# Patient Record
Sex: Female | Born: 1940 | Race: Black or African American | Hispanic: No | Smoking: Former smoker
Health system: Southern US, Community
[De-identification: ages and names within clinical notes are randomized; demographics above are authoritative.]

## PROBLEM LIST (undated history)

## (undated) DIAGNOSIS — E785 Hyperlipidemia, unspecified: Secondary | ICD-10-CM

## (undated) DIAGNOSIS — E079 Disorder of thyroid, unspecified: Secondary | ICD-10-CM

## (undated) HISTORY — DX: Hyperlipidemia, unspecified: E78.5

---

## 1988-01-25 HISTORY — PX: THYROIDECTOMY: SHX17

## 1991-08-15 DIAGNOSIS — E890 Postprocedural hypothyroidism: Secondary | ICD-10-CM | POA: Insufficient documentation

## 2000-01-25 HISTORY — PX: CHOLECYSTECTOMY: SHX55

## 2002-06-07 DIAGNOSIS — K289 Gastrojejunal ulcer, unspecified as acute or chronic, without hemorrhage or perforation: Secondary | ICD-10-CM | POA: Insufficient documentation

## 2002-06-07 DIAGNOSIS — B9681 Helicobacter pylori [H. pylori] as the cause of diseases classified elsewhere: Secondary | ICD-10-CM

## 2002-06-07 HISTORY — DX: Helicobacter pylori (H. pylori) as the cause of diseases classified elsewhere: K28.9

## 2002-06-07 HISTORY — DX: Helicobacter pylori (H. pylori) as the cause of diseases classified elsewhere: B96.81

## 2004-05-27 ENCOUNTER — Ambulatory Visit: Payer: Self-pay | Admitting: Gastroenterology

## 2004-11-29 ENCOUNTER — Ambulatory Visit: Payer: Self-pay | Admitting: Family Medicine

## 2005-12-30 ENCOUNTER — Ambulatory Visit: Payer: Self-pay | Admitting: Family Medicine

## 2007-04-03 ENCOUNTER — Ambulatory Visit: Payer: Self-pay | Admitting: Family Medicine

## 2007-07-11 DIAGNOSIS — K589 Irritable bowel syndrome without diarrhea: Secondary | ICD-10-CM | POA: Insufficient documentation

## 2008-04-04 ENCOUNTER — Ambulatory Visit: Payer: Self-pay | Admitting: Family Medicine

## 2009-04-30 DIAGNOSIS — E039 Hypothyroidism, unspecified: Secondary | ICD-10-CM | POA: Insufficient documentation

## 2009-05-07 ENCOUNTER — Ambulatory Visit: Payer: Self-pay | Admitting: Family Medicine

## 2010-05-14 ENCOUNTER — Ambulatory Visit: Payer: Self-pay | Admitting: Family Medicine

## 2010-08-16 ENCOUNTER — Ambulatory Visit: Payer: Self-pay | Admitting: Family Medicine

## 2011-05-17 ENCOUNTER — Ambulatory Visit: Payer: Self-pay | Admitting: Family Medicine

## 2012-05-17 ENCOUNTER — Ambulatory Visit: Payer: Self-pay | Admitting: Family Medicine

## 2012-12-04 ENCOUNTER — Ambulatory Visit: Payer: Self-pay | Admitting: Family Medicine

## 2012-12-04 LAB — HM DEXA SCAN

## 2013-05-20 ENCOUNTER — Ambulatory Visit: Payer: Self-pay | Admitting: Family Medicine

## 2013-11-22 LAB — HEPATIC FUNCTION PANEL
ALT: 20 U/L (ref 7–35)
AST: 23 U/L (ref 13–35)

## 2013-11-22 LAB — CBC AND DIFFERENTIAL
HCT: 41 % (ref 36–46)
Hemoglobin: 14.3 g/dL (ref 12.0–16.0)
PLATELETS: 235 10*3/uL (ref 150–399)
WBC: 7.6 10^3/mL

## 2013-11-22 LAB — TSH: TSH: 0.61 u[IU]/mL (ref 0.41–5.90)

## 2013-11-22 LAB — LIPID PANEL
CHOLESTEROL: 175 mg/dL (ref 0–200)
HDL: 62 mg/dL (ref 35–70)
LDL Cholesterol: 82 mg/dL
TRIGLYCERIDES: 143 mg/dL (ref 40–160)

## 2013-11-22 LAB — BASIC METABOLIC PANEL
BUN: 15 mg/dL (ref 4–21)
CREATININE: 0.9 mg/dL (ref 0.5–1.1)
Glucose: 100 mg/dL
POTASSIUM: 4.3 mmol/L (ref 3.4–5.3)
Sodium: 141 mmol/L (ref 137–147)

## 2014-02-13 ENCOUNTER — Ambulatory Visit: Payer: Self-pay | Admitting: Gastroenterology

## 2014-02-13 LAB — HM COLONOSCOPY

## 2014-05-22 ENCOUNTER — Ambulatory Visit
Admit: 2014-05-22 | Disposition: A | Discharge status: Home / Self Care | Payer: Self-pay | Attending: Family Medicine | Admitting: Family Medicine

## 2014-05-22 LAB — HM MAMMOGRAPHY

## 2014-11-08 ENCOUNTER — Ambulatory Visit (INDEPENDENT_AMBULATORY_CARE_PROVIDER_SITE_OTHER): Payer: Medicare Other

## 2014-11-08 DIAGNOSIS — Z23 Encounter for immunization: Secondary | ICD-10-CM | POA: Diagnosis not present

## 2014-11-12 ENCOUNTER — Other Ambulatory Visit: Payer: Self-pay | Admitting: Family Medicine

## 2014-11-12 DIAGNOSIS — E7800 Pure hypercholesterolemia, unspecified: Secondary | ICD-10-CM

## 2014-11-21 DIAGNOSIS — R002 Palpitations: Secondary | ICD-10-CM | POA: Insufficient documentation

## 2014-11-21 DIAGNOSIS — R000 Tachycardia, unspecified: Secondary | ICD-10-CM | POA: Insufficient documentation

## 2014-11-21 DIAGNOSIS — I10 Essential (primary) hypertension: Secondary | ICD-10-CM | POA: Insufficient documentation

## 2014-11-21 DIAGNOSIS — R319 Hematuria, unspecified: Secondary | ICD-10-CM | POA: Insufficient documentation

## 2014-11-21 DIAGNOSIS — E559 Vitamin D deficiency, unspecified: Secondary | ICD-10-CM | POA: Insufficient documentation

## 2014-11-21 DIAGNOSIS — R030 Elevated blood-pressure reading, without diagnosis of hypertension: Secondary | ICD-10-CM | POA: Insufficient documentation

## 2014-11-24 ENCOUNTER — Encounter: Payer: Self-pay | Admitting: Family Medicine

## 2014-11-24 ENCOUNTER — Ambulatory Visit (INDEPENDENT_AMBULATORY_CARE_PROVIDER_SITE_OTHER): Payer: Medicare Other | Admitting: Family Medicine

## 2014-11-24 VITALS — BP 138/76 | HR 84 | Temp 98.3°F | Resp 16 | Ht 66.0 in | Wt 151.0 lb

## 2014-11-24 DIAGNOSIS — E559 Vitamin D deficiency, unspecified: Secondary | ICD-10-CM

## 2014-11-24 DIAGNOSIS — R7309 Other abnormal glucose: Secondary | ICD-10-CM

## 2014-11-24 DIAGNOSIS — J309 Allergic rhinitis, unspecified: Secondary | ICD-10-CM | POA: Diagnosis not present

## 2014-11-24 DIAGNOSIS — Z0000 Encounter for general adult medical examination without abnormal findings: Secondary | ICD-10-CM | POA: Diagnosis not present

## 2014-11-24 DIAGNOSIS — E7800 Pure hypercholesterolemia, unspecified: Secondary | ICD-10-CM

## 2014-11-24 DIAGNOSIS — M8580 Other specified disorders of bone density and structure, unspecified site: Secondary | ICD-10-CM | POA: Insufficient documentation

## 2014-11-24 DIAGNOSIS — E890 Postprocedural hypothyroidism: Secondary | ICD-10-CM

## 2014-11-24 NOTE — Progress Notes (Signed)
Patient ID: Pamelia Hoit, female   DOB: 1940-09-29, 74 y.o.   MRN: [id number]          Patient: Alison Blackwell, Female    DOB: September 06, 1940, 74 y.o.   MRN: [id number] Visit Date: 11/24/2014  Today's Provider: Lorie Phenix, Alison Blackwell   Chief Complaint  Patient presents with  . Annual Exam   Subjective:    Annual wellness visit Alison Blackwell is a 74 y.o. female. She feels well. She reports exercises everyday. She reports she is sleeping well.  Does have some chronic congestion.  Does not want to change treatment.    Review of Systems  Constitutional: Negative.   HENT: Positive for rhinorrhea and sinus pressure.   Eyes: Negative.   Respiratory: Negative.   Cardiovascular: Negative.   Gastrointestinal: Negative.   Endocrine: Negative.   Genitourinary: Negative.   Musculoskeletal: Negative.   Skin: Negative.   Allergic/Immunologic: Negative.   Neurological: Negative.   Hematological: Negative.   Psychiatric/Behavioral: Negative.     Social History   Social History  . Marital Status: Divorced    Spouse Name: N/A  . Number of Children: 3  . Years of Education: College   Occupational History  . Retired    Social History Main Topics  . Smoking status: Former Games developer  . Smokeless tobacco: Never Used  . Alcohol Use: Yes     Comment: Occasionally  . Drug Use: No  . Sexual Activity: Not on file   Other Topics Concern  . Not on file   Social History Narrative    Patient Active Problem List   Diagnosis Date Noted  . Essential (primary) hypertension 11/21/2014  . Blood in the urine 11/21/2014  . Calcium blood increased 11/21/2014  . Awareness of heartbeats 11/21/2014  . Blood pressure elevated without history of HTN 11/21/2014  . Fast heart beat 11/21/2014  . Avitaminosis D 11/21/2014  . Abnormal blood sugar 06/08/2009  . Adult hypothyroidism 04/30/2009  . Allergic rhinitis 12/12/2007  . 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 Gastrointestinal ulcer due to  Helicobacter pylori 06/07/2002  . Acid reflux 05/17/2002  . Hypercholesteremia 04/08/1998  . Hypothyroidism, postop 08/15/1991    Past Surgical History  Procedure Laterality Date  . Cholecystectomy  2002  . Thyroidectomy  1990    Her family history includes Healthy in her sister and sister; Lung cancer in her mother.    Previous Medications   ASPIRIN EC 81 MG TABLET    Take 1 tablet by mouth daily.   ATORVASTATIN (LIPITOR) 10 MG TABLET    TAKE 1 TABLET BY MOUTH EVERY DAY   FLUTICASONE (FLONASE) 50 MCG/ACT NASAL SPRAY    Place 2 sprays into the nose daily.   MONTELUKAST (SINGULAIR) 10 MG TABLET    Take 1 tablet by mouth daily.   MULTIPLE VITAMIN (MULTI-VITAMINS) TABS    Take 1 tablet by mouth daily.   OMEGA 3-6-9 CAPS    Take 1 capsule by mouth daily.   SYNTHROID 50 MCG TABLET    ONCE DAILY BY MOUTH DO NOT SUBSTITUTE PLEASE    Patient Care Team: Lorie Phenix, Alison Blackwell as PCP - General (Family Medicine)     Objective:   Vitals: BP 138/76 mmHg  Pulse 84  Temp(Src) 98.3 F (36.8 C) (Oral)  Resp 16  Ht  (1.676 m)  Wt 151 lb (68.493 kg)  BMI 24.38 kg/m2  Physical Exam  Constitutional: She is oriented to person, place, and time. She appears well-developed and well-nourished.  HENT:  Head: Normocephalic and atraumatic.  Right Ear: Tympanic membrane, external ear and ear canal normal.  Left Ear: Tympanic membrane, external ear and ear canal normal.  Nose: Mucosal edema present.  Mouth/Throat: Uvula is midline, oropharynx is clear and moist and mucous membranes are normal.  Eyes: Conjunctivae, EOM and lids are normal. Pupils are equal, round, and reactive to light. Lids are everted and swept, no foreign bodies found.  Neck: Carotid bruit is not present.  Cardiovascular: Normal rate, regular rhythm, normal heart sounds and normal pulses.   Pulmonary/Chest: Effort normal and breath sounds normal. Right breast exhibits no inverted nipple, no mass, no nipple discharge, no skin  change and no tenderness. Left breast exhibits no inverted nipple, no mass, no nipple discharge, no skin change and no tenderness. Breasts are symmetrical.  Abdominal: Soft. Normal appearance, normal aorta and bowel sounds are normal. There is no tenderness.  Musculoskeletal: Normal range of motion.  Lymphadenopathy:    She has no cervical adenopathy.    She has no axillary adenopathy.  Neurological: She is alert and oriented to person, place, and time.  Skin: Skin is warm, dry and intact.  Psychiatric: She has a normal mood and affect. Her speech is normal and behavior is normal. Judgment and thought content normal. Cognition and memory are normal.    Activities of Daily Living In your present state of health, do you have any difficulty performing the following activities: 11/24/2014  Hearing? N  Vision? N  Difficulty concentrating or making decisions? N  Walking or climbing stairs? N  Dressing or bathing? N  Doing errands, shopping? N    Fall Risk Assessment Fall Risk  11/24/2014  Falls in the past year? No     Depression Screen PHQ 2/9 Scores 11/24/2014  PHQ - 2 Score 0    Cognitive Testing - 6-CIT  Correct? Score   What year is it? yes 0 0 or 4  What month is it? yes 0 0 or 3  Memorize:    Floyde Parkins,  43,  High [address],  [place],      What time is it? (within 1 hour) yes 0 0 or 3  Count backwards from 20 yes 0 0, 2, or 4  Name the months of the year yes 0 0, 2, or 4  Repeat name & address above no 3 0, 2, 4, 6, 8, or 10       TOTAL SCORE  3/28   Interpretation:  Normal  Normal (0-7) Abnormal (8-28)       Assessment & Plan:     Annual Wellness Visit  Reviewed patient's Family Medical History Reviewed and updated list of patient's medical providers Assessment of cognitive impairment was done Assessed patient's functional ability Established a written schedule for health screening services Health Risk Assessent Completed and Reviewed  Exercise Activities  and Dietary recommendations Goals    None      Immunization History  Administered Date(s) Administered  . Influenza, High Dose Seasonal PF 11/08/2014  . Pneumococcal Conjugate-13 11/02/2013  . Pneumococcal Polysaccharide-23 11/17/2007  . Td 05/14/2008  . Zoster 11/22/2007    Health Maintenance  Topic Date Due  . INFLUENZA VACCINE  08/25/2015  . MAMMOGRAM  05/21/2016  . TETANUS/TDAP  05/15/2018  . COLONOSCOPY  02/14/2024  . DEXA SCAN  Completed  . ZOSTAVAX  Completed  . PNA vac Low Risk Adult  Completed      Discussed health benefits of physical activity, and encouraged her  to engage in regular exercise appropriate for her age and condition.     1. Medicare annual wellness visit, subsequent As above.   2. Allergic rhinitis, unspecified allergic rhinitis type Continue medication. Call if worsens.   - CBC with Differential/Platelet  3. Postoperative hypothyroidism Check labs.  - TSH  4. Avitaminosis D Check labs.  - Vit D  25 hydroxy (rtn osteoporosis monitoring)  5. Hypercholesteremia Stable.  - Lipid panel  6. Osteopenia Will order recheck.  - DG Bone Density  7. Abnormal blood sugar Stable.  - Comprehensive metabolic panel - Hemoglobin A1c   Patient was seen and examined by Alison GrosserNancy J. Alaisa Moffitt, Alison Blackwell, and note scribed by Kavin LeechLaura Walsh, CMA.  I have reviewed the document for accuracy and completeness and I agree with above. Alison Grosser- Denzell Colasanti J. Coner Gibbard, Alison Blackwell   Lorie PhenixNancy Fionna Merriott, Alison Blackwell  ------------------------------------------------------------------------------------------------------------

## 2014-11-25 LAB — LIPID PANEL
Chol/HDL Ratio: 2.6 ratio units (ref 0.0–4.4)
Cholesterol, Total: 163 mg/dL (ref 100–199)
HDL: 63 mg/dL (ref >39)
LDL Calculated: 75 mg/dL (ref 0–99)
TRIGLYCERIDES: 125 mg/dL (ref 0–149)
VLDL CHOLESTEROL CAL: 25 mg/dL (ref 5–40)

## 2014-11-25 LAB — COMPREHENSIVE METABOLIC PANEL
ALT: 19 IU/L (ref 0–32)
AST: 26 IU/L (ref 0–40)
Albumin/Globulin Ratio: 2.3 (ref 1.1–2.5)
Albumin: 4.8 g/dL (ref 3.5–4.8)
Alkaline Phosphatase: 71 IU/L (ref 39–117)
BUN/Creatinine Ratio: 17 (ref 11–26)
BUN: 15 mg/dL (ref 8–27)
Bilirubin Total: 1.2 mg/dL (ref 0.0–1.2)
CALCIUM: 10.3 mg/dL (ref 8.7–10.3)
CO2: 26 mmol/L (ref 18–29)
CREATININE: 0.9 mg/dL (ref 0.57–1.00)
Chloride: 101 mmol/L (ref 97–106)
GFR, EST AFRICAN AMERICAN: 73 mL/min/{1.73_m2} (ref >59)
GFR, EST NON AFRICAN AMERICAN: 64 mL/min/{1.73_m2} (ref >59)
GLUCOSE: 96 mg/dL (ref 65–99)
Globulin, Total: 2.1 g/dL (ref 1.5–4.5)
POTASSIUM: 4.4 mmol/L (ref 3.5–5.2)
Sodium: 143 mmol/L (ref 136–144)
TOTAL PROTEIN: 6.9 g/dL (ref 6.0–8.5)

## 2014-11-25 LAB — CBC WITH DIFFERENTIAL/PLATELET
Basophils Absolute: 0 10*3/uL (ref 0.0–0.2)
Basos: 1 %
EOS (ABSOLUTE): 0.1 10*3/uL (ref 0.0–0.4)
Eos: 2 %
Hematocrit: 42 % (ref 34.0–46.6)
Hemoglobin: 14.3 g/dL (ref 11.1–15.9)
IMMATURE GRANULOCYTES: 0 %
Immature Grans (Abs): 0 10*3/uL (ref 0.0–0.1)
LYMPHS: 21 %
Lymphocytes Absolute: 1.3 10*3/uL (ref 0.7–3.1)
MCH: 30.7 pg (ref 26.6–33.0)
MCHC: 34 g/dL (ref 31.5–35.7)
MCV: 90 fL (ref 79–97)
MONOS ABS: 0.5 10*3/uL (ref 0.1–0.9)
Monocytes: 8 %
NEUTROS PCT: 68 %
Neutrophils Absolute: 4.3 10*3/uL (ref 1.4–7.0)
PLATELETS: 258 10*3/uL (ref 150–379)
RBC: 4.66 x10E6/uL (ref 3.77–5.28)
RDW: 13 % (ref 12.3–15.4)
WBC: 6.2 10*3/uL (ref 3.4–10.8)

## 2014-11-25 LAB — VITAMIN D 25 HYDROXY (VIT D DEFICIENCY, FRACTURES): Vit D, 25-Hydroxy: 105 ng/mL — ABNORMAL HIGH (ref 30.0–100.0)

## 2014-11-25 LAB — HEMOGLOBIN A1C
ESTIMATED AVERAGE GLUCOSE: 128 mg/dL
Hgb A1c MFr Bld: 6.1 % — ABNORMAL HIGH (ref 4.8–5.6)

## 2014-11-25 LAB — TSH: TSH: 0.453 u[IU]/mL (ref 0.450–4.500)

## 2014-12-10 ENCOUNTER — Ambulatory Visit
Admission: RE | Admit: 2014-12-10 | Discharge: 2014-12-10 | Disposition: A | Discharge status: Home / Self Care | Payer: Medicare Other | Source: Ambulatory Visit | Attending: Family Medicine | Admitting: Family Medicine

## 2014-12-10 DIAGNOSIS — Z780 Asymptomatic menopausal state: Secondary | ICD-10-CM | POA: Diagnosis not present

## 2014-12-10 DIAGNOSIS — M8580 Other specified disorders of bone density and structure, unspecified site: Secondary | ICD-10-CM | POA: Diagnosis not present

## 2014-12-11 ENCOUNTER — Telehealth: Payer: Self-pay

## 2014-12-11 NOTE — Telephone Encounter (Signed)
Advised pt of results. Pt verbally acknowledges understanding. Alison Blackwell, CMA   

## 2014-12-11 NOTE — Telephone Encounter (Signed)
-----   Message from Lorie PhenixNancy Maloney, MD sent at 12/10/2014  2:08 PM EST ----- Bone thinning. Not osteoporosis.   Make sure to continue weight bearing exercise and recheck in 2 years. Thanks.

## 2015-02-14 ENCOUNTER — Telehealth: Payer: Self-pay | Admitting: Family Medicine

## 2015-02-14 NOTE — Telephone Encounter (Signed)
Can try OTC Dramamine. If she has any fever, abdominal pain of if dramamine doesn't help she will need to go to ER.

## 2015-02-14 NOTE — Telephone Encounter (Signed)
Pt advised and she voiced understanding.

## 2015-02-14 NOTE — Telephone Encounter (Signed)
Pt states she has inner ear infection, pt states she has had this before,she can't drive due to vomiting, pt state she has be nauseous with this. Would like to see if she could have something called in @ [facility] [address] Thanks, CC

## 2015-03-17 ENCOUNTER — Encounter: Payer: Self-pay | Admitting: Family Medicine

## 2015-03-17 ENCOUNTER — Ambulatory Visit (INDEPENDENT_AMBULATORY_CARE_PROVIDER_SITE_OTHER): Payer: Medicare Other | Admitting: Family Medicine

## 2015-03-17 VITALS — BP 154/88 | HR 96 | Temp 98.0°F | Resp 16 | Wt 154.0 lb

## 2015-03-17 DIAGNOSIS — R319 Hematuria, unspecified: Secondary | ICD-10-CM

## 2015-03-17 DIAGNOSIS — R42 Dizziness and giddiness: Secondary | ICD-10-CM | POA: Insufficient documentation

## 2015-03-17 DIAGNOSIS — R1030 Lower abdominal pain, unspecified: Secondary | ICD-10-CM

## 2015-03-17 DIAGNOSIS — N3090 Cystitis, unspecified without hematuria: Secondary | ICD-10-CM | POA: Diagnosis not present

## 2015-03-17 DIAGNOSIS — J309 Allergic rhinitis, unspecified: Secondary | ICD-10-CM | POA: Diagnosis not present

## 2015-03-17 LAB — POCT URINALYSIS DIPSTICK
Bilirubin, UA: NEGATIVE
GLUCOSE UA: NEGATIVE
Ketones, UA: NEGATIVE
Nitrite, UA: NEGATIVE
Spec Grav, UA: 1.015
UROBILINOGEN UA: 0.2
pH, UA: 6

## 2015-03-17 MED ORDER — MECLIZINE HCL 25 MG PO TABS: 25.0000 mg | ORAL_TABLET | Freq: Three times a day (TID) | ORAL | Status: DC | PRN

## 2015-03-17 MED ORDER — FLUTICASONE PROPIONATE 50 MCG/ACT NA SUSP: 2.0000 | Freq: Every day | NASAL | Status: DC

## 2015-03-17 MED ORDER — MONTELUKAST SODIUM 10 MG PO TABS: 10.0000 mg | ORAL_TABLET | Freq: Every day | ORAL | Status: DC

## 2015-03-17 NOTE — Progress Notes (Signed)
Subjective:     Patient ID: Alison Blackwell, female   DOB: Mar 24, 1940, 75 y.o.   MRN: [id number]  Chief Complaint  Patient presents with  . Urinary Tract Infection    Since early this am. Pt c/o lower ABD pain, back pain, and frequency.    Urinary Tract Infection  This is a new problem. The current episode started today (this am). The problem has been gradually improving. The quality of the pain is described as aching. The pain is mild. There has been no fever. She is not sexually active. There is no history of pyelonephritis. Associated symptoms include frequency (after increased fluid intake). Pertinent negatives include no flank pain, hematuria, hesitancy, nausea, urgency or vomiting. Associated symptoms comments: Lower back "stiffness" this am, bilateral lower abd dull pain, resolved. She has tried nothing for the symptoms. The treatment provided moderate relief. There is no history of kidney stones, recurrent UTIs or urinary stasis. UTI years ago. Not recently     Allergies Headache starting this morning after leaving the house. Has had postnasal drip down back of throat since December. Right maxillary and frontal sinus tender to palpation. No rhinorrhea, watery eyes, itchy eyes. Intermittent sneezing. No URI or cold-like sx in previous weeks. She is taking the Flonase and Singulair prn. Not taking daily.   Reports allergy sx around this time yearly.   Dizziness for one day last week.  Did resolve.  Has distant history of the same. Was not positional.  Dramamine helped. No other symptoms.   Patient Active Problem List   Diagnosis Date Noted  . Osteopenia 11/24/2014  . Essential (primary) hypertension 11/21/2014  . Blood in the urine 11/21/2014  . Calcium blood increased 11/21/2014  . Awareness of heartbeats 11/21/2014  . Blood pressure elevated without history of HTN 11/21/2014  . Fast heart beat 11/21/2014  . Avitaminosis D 11/21/2014  . Abnormal blood sugar 06/08/2009  . Adult  hypothyroidism 04/30/2009  . Allergic rhinitis 12/12/2007  . 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 Gastrointestinal ulcer due to Helicobacter pylori 06/07/2002  . Acid reflux 05/17/2002  . Hypercholesteremia 04/08/1998  . Hypothyroidism, postop 08/15/1991   Previous Medications   ASPIRIN EC 81 MG TABLET    Take 1 tablet by mouth daily.   ATORVASTATIN (LIPITOR) 10 MG TABLET    TAKE 1 TABLET BY MOUTH EVERY DAY   FLUTICASONE (FLONASE) 50 MCG/ACT NASAL SPRAY    Place 2 sprays into the nose daily.   MONTELUKAST (SINGULAIR) 10 MG TABLET    Take 1 tablet by mouth daily.   MULTIPLE VITAMIN (MULTI-VITAMINS) TABS    Take 1 tablet by mouth daily.   OMEGA 3-6-9 CAPS    Take 1 capsule by mouth daily.   SYNTHROID 50 MCG TABLET    ONCE DAILY BY MOUTH DO NOT SUBSTITUTE PLEASE   No Known Allergies Past Surgical History  Procedure Laterality Date  . Cholecystectomy  2002  . Thyroidectomy  1990   Family History  Problem Relation Age of Onset  . Lung cancer Mother   . Healthy Sister   . Healthy Sister    Social History   Social History  . Marital Status: Divorced    Spouse Name: N/A  . Number of Children: 3  . Years of Education: College   Occupational History  . Retired    Social History Main Topics  . Smoking status: Former Games developer  . Smokeless tobacco: Never Used  . Alcohol Use: Yes     Comment: Occasionally  .  Drug Use: No  . Sexual Activity: Not on file   Other Topics Concern  . Not on file   Social History Narrative      Review of Systems  Constitutional: Negative.   HENT: Positive for postnasal drip. Negative for congestion, ear pain, mouth sores, nosebleeds, rhinorrhea and sore throat.        Sinus tenderness  Eyes: Negative.   Respiratory: Negative.   Cardiovascular: Negative.   Gastrointestinal: Negative for nausea and vomiting.  Genitourinary: Positive for frequency (after increased fluid intake) and pelvic pain (bilateral lower abd pain). Negative for hesitancy,  urgency, hematuria and flank pain.  Musculoskeletal: Positive for back pain.  Skin: Negative.   Neurological: Negative.        Objective:   Physical Exam  Constitutional: She is oriented to person, place, and time. She appears well-developed and well-nourished.  HENT:  Head: Normocephalic and atraumatic.  Mouth/Throat: Oropharynx is clear and moist. No oropharyngeal exudate.  Sinuses tender to palpation.  Swollen turbinates  Eyes: EOM are normal. Pupils are equal, round, and reactive to light.  Neck: Normal range of motion. Neck supple.  Cardiovascular: Normal rate and regular rhythm.   Pulmonary/Chest: Effort normal and breath sounds normal. No respiratory distress. She has no wheezes.  Abdominal: Soft. Bowel sounds are normal. There is no tenderness.  No suprapubic or CVA tenderness.   Neurological: She is alert and oriented to person, place, and time.  Psychiatric: She has a normal mood and affect.  Vitals reviewed.   BP 154/88 mmHg  Pulse 96  Temp(Src) 98 F (36.7 C) (Oral)  Resp 16  Wt 154 lb (69.854 kg)     Assessment:     Multiple issues as below.     Plan:     1. Lower abdominal pain Improving. Urine borderline.  - POCT urinalysis dipstick Results for orders placed or performed in visit on 03/17/15  POCT urinalysis dipstick  Result Value Ref Range   Color, UA Yellow    Clarity, UA Clear    Glucose, UA Negative    Bilirubin, UA Negative    Ketones, UA Negative    Spec Grav, UA 1.015    Blood, UA NH Trace    pH, UA 6.0    Protein, UA Trace    Urobilinogen, UA 0.2    Nitrite, UA Negative    Leukocytes, UA Trace (A) Negative   2. Cystitis Improving with hydration. Continue to hydrate.  Patient instructed to call back if condition worsens or does not improve.     3. Allergic rhinitis, unspecified allergic rhinitis type Worsening.  Restart medication.  - fluticasone (FLONASE) 50 MCG/ACT nasal spray; Place 2 sprays into both nostrils daily.  Dispense:  16 g; Refill: 5 - montelukast (SINGULAIR) 10 MG tablet; Take 1 tablet (10 mg total) by mouth daily.  Dispense: 30 tablet; Refill: 5  4. Vertigo Improved. Will refill medication.  Warnings given for neurologic issues requiring medical attention. Also warned about sedation.   - meclizine (ANTIVERT) 25 MG tablet; Take 1 tablet (25 mg total) by mouth 3 (three) times daily as needed for dizziness.  Dispense: 30 tablet; Refill: 0   Lorie Phenix, MD

## 2015-03-18 LAB — URINALYSIS, ROUTINE W REFLEX MICROSCOPIC
Bilirubin, UA: NEGATIVE
GLUCOSE, UA: NEGATIVE
Ketones, UA: NEGATIVE
Leukocytes, UA: NEGATIVE
Nitrite, UA: NEGATIVE
PROTEIN UA: NEGATIVE
RBC, UA: NEGATIVE
Specific Gravity, UA: 1.015 (ref 1.005–1.030)
UUROB: 0.2 mg/dL (ref 0.2–1.0)
pH, UA: 6 (ref 5.0–7.5)

## 2015-03-19 ENCOUNTER — Telehealth: Payer: Self-pay

## 2015-03-19 LAB — URINE CULTURE: ORGANISM ID, BACTERIA: NO GROWTH

## 2015-03-19 NOTE — Telephone Encounter (Signed)
-----   Message from Lorie Phenix, MD sent at 03/19/2015  6:59 AM EST ----- No growth. Please notify patient. Thanks.

## 2015-03-19 NOTE — Telephone Encounter (Signed)
Pt advised.   Thanks,   -Laura  

## 2015-05-04 ENCOUNTER — Other Ambulatory Visit: Payer: Self-pay | Admitting: Family Medicine

## 2015-05-04 DIAGNOSIS — Z1231 Encounter for screening mammogram for malignant neoplasm of breast: Secondary | ICD-10-CM

## 2015-05-07 ENCOUNTER — Other Ambulatory Visit: Payer: Self-pay | Admitting: Family Medicine

## 2015-05-07 DIAGNOSIS — E7800 Pure hypercholesterolemia, unspecified: Secondary | ICD-10-CM

## 2015-05-25 ENCOUNTER — Other Ambulatory Visit: Payer: Self-pay | Admitting: Family Medicine

## 2015-05-25 ENCOUNTER — Ambulatory Visit
Admission: RE | Admit: 2015-05-25 | Discharge: 2015-05-25 | Disposition: A | Discharge status: Home / Self Care | Payer: Medicare Other | Source: Ambulatory Visit | Attending: Family Medicine | Admitting: Family Medicine

## 2015-05-25 DIAGNOSIS — Z1231 Encounter for screening mammogram for malignant neoplasm of breast: Secondary | ICD-10-CM | POA: Diagnosis not present

## 2015-10-29 ENCOUNTER — Other Ambulatory Visit: Payer: Self-pay | Admitting: Family Medicine

## 2015-10-29 DIAGNOSIS — E7800 Pure hypercholesterolemia, unspecified: Secondary | ICD-10-CM

## 2015-11-06 ENCOUNTER — Ambulatory Visit (INDEPENDENT_AMBULATORY_CARE_PROVIDER_SITE_OTHER): Payer: Medicare Other

## 2015-11-06 DIAGNOSIS — Z23 Encounter for immunization: Secondary | ICD-10-CM

## 2015-11-25 ENCOUNTER — Ambulatory Visit (INDEPENDENT_AMBULATORY_CARE_PROVIDER_SITE_OTHER): Payer: Medicare Other | Admitting: Physician Assistant

## 2015-11-25 ENCOUNTER — Encounter: Payer: Medicare Other | Admitting: Family Medicine

## 2015-11-25 ENCOUNTER — Encounter: Payer: Self-pay | Admitting: Physician Assistant

## 2015-11-25 VITALS — BP 126/70 | HR 80 | Temp 98.0°F | Resp 16 | Ht 65.0 in | Wt 152.0 lb

## 2015-11-25 DIAGNOSIS — R7309 Other abnormal glucose: Secondary | ICD-10-CM

## 2015-11-25 DIAGNOSIS — Z0000 Encounter for general adult medical examination without abnormal findings: Secondary | ICD-10-CM | POA: Diagnosis not present

## 2015-11-25 DIAGNOSIS — E7800 Pure hypercholesterolemia, unspecified: Secondary | ICD-10-CM | POA: Diagnosis not present

## 2015-11-25 DIAGNOSIS — I10 Essential (primary) hypertension: Secondary | ICD-10-CM | POA: Diagnosis not present

## 2015-11-25 NOTE — Patient Instructions (Signed)

## 2015-11-25 NOTE — Progress Notes (Signed)
Patient: Alison Blackwell, Female    DOB: 1940/05/01, 75 y.o.   MRN: [id number] Visit Date: 11/25/2015  Today's Provider: Margaretann Loveless, PA-C   Chief Complaint  Patient presents with  . Medicare Wellness   Subjective:    Annual wellness visit Alison Blackwell is a 75 y.o. female who presents today for her Subsequent Annual Wellness Visit. She feels well. She reports exercising 3 days a week. She reports she is sleeping well.  11/24/14 AWE 05/25/15 Mammogram-BI-RADS 1 02/13/14 Colonoscopy-Diverticulosis; was told she did not have to have another unless issues Dr. Harold Hedge - follows her thyroid Dr. Paraschos-cardiology Dr. Bluford Kaufmann- Gastroenterology -----------------------------------------------------------   Review of Systems  Constitutional: Negative.   HENT: Positive for postnasal drip and sinus pressure.   Eyes: Negative.   Respiratory: Negative.   Cardiovascular: Negative.   Gastrointestinal: Negative.   Endocrine: Negative.   Genitourinary: Negative.   Musculoskeletal: Positive for myalgias.  Skin: Negative.   Allergic/Immunologic: Positive for environmental allergies.  Neurological: Negative.   Hematological: Negative.   Psychiatric/Behavioral: Negative.     Social History   Social History  . Marital status: Divorced    Spouse name: N/A  . Number of children: 3  . Years of education: College   Occupational History  . Retired    Social History Main Topics  . Smoking status: Former Games developer  . Smokeless tobacco: Never Used  . Alcohol use Yes     Comment: Occasionally  . Drug use: No  . Sexual activity: Not on file   Other Topics Concern  . Not on file   Social History Narrative  . No narrative on file    Patient Active Problem List   Diagnosis Date Noted  . Cystitis 03/17/2015  . Vertigo 03/17/2015  . Osteopenia 11/24/2014  . Essential (primary) hypertension 11/21/2014  . Blood in the urine 11/21/2014  . Calcium blood increased 11/21/2014   . Awareness of heartbeats 11/21/2014  . Blood pressure elevated without history of HTN 11/21/2014  . Fast heart beat 11/21/2014  . Avitaminosis D 11/21/2014  . Abnormal blood sugar 06/08/2009  . Allergic rhinitis 12/12/2007  . 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 Gastrointestinal ulcer due to Helicobacter pylori 06/07/2002  . Acid reflux 05/17/2002  . Hypercholesteremia 04/08/1998  . Hypothyroidism, postop 08/15/1991    Past Surgical History:  Procedure Laterality Date  . CHOLECYSTECTOMY  2002  . THYROIDECTOMY  1990    Her family history includes Breast cancer (age of onset: 1) in her daughter; Breast cancer (age of onset: 33) in her maternal aunt; Healthy in her daughter, sister, sister, and son; Lung cancer in her mother.    Previous Medications   ASPIRIN EC 81 MG TABLET    Take 1 tablet by mouth daily.   ATORVASTATIN (LIPITOR) 10 MG TABLET    TAKE 1 TABLET BY MOUTH EVERY DAY   FLUTICASONE (FLONASE) 50 MCG/ACT NASAL SPRAY    Place 2 sprays into both nostrils daily.   MECLIZINE (ANTIVERT) 25 MG TABLET    Take 1 tablet (25 mg total) by mouth 3 (three) times daily as needed for dizziness.   MONTELUKAST (SINGULAIR) 10 MG TABLET    Take 1 tablet (10 mg total) by mouth daily.   MULTIPLE VITAMIN (MULTI-VITAMINS) TABS    Take 1 tablet by mouth daily.   OMEGA 3-6-9 CAPS    Take 1 capsule by mouth daily.   SYNTHROID 50 MCG TABLET    ONCE DAILY BY MOUTH DO NOT SUBSTITUTE PLEASE  Patient Care Team: Margaretann LovelessJennifer M Meyah Corle, PA-C as PCP - General (Family Medicine)     Objective:   Vitals: BP 126/70 (BP Location: Right Arm, Patient Position: Sitting, Cuff Size: Large)   Pulse 80   Temp 98 F (36.7 C) (Oral)   Resp 16   Ht 5\' 5"  (1.651 m)   Wt 152 lb (68.9 kg)   BMI 25.29 kg/m   Physical Exam  Constitutional: She is oriented to person, place, and time. She appears well-developed and well-nourished. No distress.  HENT:  Head: Normocephalic and atraumatic.  Right Ear: Tympanic  membrane, external ear and ear canal normal.  Left Ear: Tympanic membrane, external ear and ear canal normal.  Nose: Nose normal.  Mouth/Throat: Uvula is midline, oropharynx is clear and moist and mucous membranes are normal. No oropharyngeal exudate.  Eyes: Conjunctivae and EOM are normal. Pupils are equal, round, and reactive to light. Right eye exhibits no discharge. Left eye exhibits no discharge. No scleral icterus.  Neck: Normal range of motion. Neck supple. No JVD present. Carotid bruit is not present. No tracheal deviation present. No thyromegaly present.  Cardiovascular: Normal rate, regular rhythm, normal heart sounds and intact distal pulses.  Exam reveals no gallop and no friction rub.   No murmur heard. Pulmonary/Chest: Effort normal and breath sounds normal. No respiratory distress. She has no wheezes. She has no rales. She exhibits no tenderness.  Abdominal: Soft. Bowel sounds are normal. She exhibits no distension and no mass. There is no tenderness. There is no rebound and no guarding.  Musculoskeletal: Normal range of motion. She exhibits no edema or tenderness.  Lymphadenopathy:    She has no cervical adenopathy.  Neurological: She is alert and oriented to person, place, and time.  Skin: Skin is warm and dry. No rash noted. She is not diaphoretic.  Psychiatric: She has a normal mood and affect. Her behavior is normal. Judgment and thought content normal.  Vitals reviewed.   Activities of Daily Living In your present state of health, do you have any difficulty performing the following activities: 11/25/2015  Hearing? N  Vision? N  Difficulty concentrating or making decisions? N  Walking or climbing stairs? N  Dressing or bathing? N  Doing errands, shopping? N  Some recent data might be hidden    Fall Risk Assessment Fall Risk  11/25/2015 11/24/2014  Falls in the past year? No No     Depression Screen PHQ 2/9 Scores 11/25/2015 11/24/2014  PHQ - 2 Score 0 0     Cognitive Testing - 6-CIT  Correct? Score   What year is it? yes 0 0 or 4  What month is it? yes 0 0 or 3  Memorize:    Floyde ParkinsJohn,  [place],  43,  [address],  [place],      What time is it? (within 1 hour) yes 0 0 or 3  Count backwards from 20 yes 0 0, 2, or 4  Name the months of the year yes 0 0, 2, or 4  Repeat name & address above yes 1 0, 2, 4, 6, 8, or 10       TOTAL SCORE  1/28   Interpretation:  Normal  Normal (0-7) Abnormal (8-28)   Audit-C Alcohol Use Screening  Question Answer Points  How often do you have alcoholic drink? never 0  On days you do drink alcohol, how many drinks do you typically consume? 0 0  How oftey will you drink 6 or more in a  total? never 0  Total Score:  0   A score of 3 or more in women, and 4 or more in men indicates increased risk for alcohol abuse, EXCEPT if all of the points are from question 1.      Assessment & Plan:     Annual Wellness Visit  Reviewed patient's Family Medical History Reviewed and updated list of patient's medical providers Assessment of cognitive impairment was done Assessed patient's functional ability Established a written schedule for health screening services Health Risk Assessent Completed and Reviewed  Exercise Activities and Dietary recommendations Goals    None      Immunization History  Administered Date(s) Administered  . Influenza, High Dose Seasonal PF 11/08/2014, 11/06/2015  . Pneumococcal Conjugate-13 11/02/2013  . Pneumococcal Polysaccharide-23 11/17/2007  . Td 05/14/2008  . Zoster 11/22/2007    Health Maintenance  Topic Date Due  . MAMMOGRAM  05/24/2017  . TETANUS/TDAP  05/15/2018  . COLONOSCOPY  02/14/2024  . INFLUENZA VACCINE  Completed  . DEXA SCAN  Completed  . ZOSTAVAX  Completed  . PNA vac Low Risk Adult  Completed      Discussed health benefits of physical activity, and encouraged her to engage in regular exercise appropriate for her age and condition.   1. Medicare  annual wellness visit, subsequent Normal physical exam. Will see her back in one year unless she has any acute issue.  2. Essential (primary) hypertension Stable. Continue current medical treatment plan. Will check labs as below and f/u pending results. - CBC w/Diff/Platelet - Comprehensive Metabolic Panel (CMET)  3. Hypercholesteremia Stable. Continue current medical treatment plan. Will check labs as below and f/u pending results. - Lipid Profile  4. Abnormal blood sugar Will check labs as below and f/u pending results. - Comprehensive Metabolic Panel (CMET) - HgB A1c  ------------------------------------------------------------------------------------------------------------

## 2015-11-26 ENCOUNTER — Telehealth: Payer: Self-pay

## 2015-11-26 LAB — CBC WITH DIFFERENTIAL/PLATELET
BASOS ABS: 0 10*3/uL (ref 0.0–0.2)
BASOS: 0 %
EOS (ABSOLUTE): 0.1 10*3/uL (ref 0.0–0.4)
Eos: 2 %
Hematocrit: 42.9 % (ref 34.0–46.6)
Hemoglobin: 15 g/dL (ref 11.1–15.9)
Immature Grans (Abs): 0 10*3/uL (ref 0.0–0.1)
Immature Granulocytes: 0 %
LYMPHS ABS: 1.4 10*3/uL (ref 0.7–3.1)
Lymphs: 19 %
MCH: 31.6 pg (ref 26.6–33.0)
MCHC: 35 g/dL (ref 31.5–35.7)
MCV: 91 fL (ref 79–97)
MONOS ABS: 0.5 10*3/uL (ref 0.1–0.9)
Monocytes: 7 %
NEUTROS ABS: 5.1 10*3/uL (ref 1.4–7.0)
Neutrophils: 72 %
PLATELETS: 245 10*3/uL (ref 150–379)
RBC: 4.74 x10E6/uL (ref 3.77–5.28)
RDW: 13.3 % (ref 12.3–15.4)
WBC: 7.1 10*3/uL (ref 3.4–10.8)

## 2015-11-26 LAB — LIPID PANEL
Chol/HDL Ratio: 3 ratio units (ref 0.0–4.4)
Cholesterol, Total: 177 mg/dL (ref 100–199)
HDL: 60 mg/dL (ref >39)
LDL Calculated: 89 mg/dL (ref 0–99)
TRIGLYCERIDES: 142 mg/dL (ref 0–149)
VLDL Cholesterol Cal: 28 mg/dL (ref 5–40)

## 2015-11-26 LAB — COMPREHENSIVE METABOLIC PANEL
A/G RATIO: 1.7 (ref 1.2–2.2)
ALT: 19 IU/L (ref 0–32)
AST: 21 IU/L (ref 0–40)
Albumin: 5 g/dL — ABNORMAL HIGH (ref 3.5–4.8)
Alkaline Phosphatase: 82 IU/L (ref 39–117)
BILIRUBIN TOTAL: 0.9 mg/dL (ref 0.0–1.2)
BUN/Creatinine Ratio: 12 (ref 12–28)
BUN: 10 mg/dL (ref 8–27)
CALCIUM: 10.3 mg/dL (ref 8.7–10.3)
CHLORIDE: 100 mmol/L (ref 96–106)
CO2: 28 mmol/L (ref 18–29)
Creatinine, Ser: 0.85 mg/dL (ref 0.57–1.00)
GFR calc Af Amer: 78 mL/min/{1.73_m2} (ref >59)
GFR, EST NON AFRICAN AMERICAN: 68 mL/min/{1.73_m2} (ref >59)
GLUCOSE: 105 mg/dL — AB (ref 65–99)
Globulin, Total: 2.9 g/dL (ref 1.5–4.5)
POTASSIUM: 4.3 mmol/L (ref 3.5–5.2)
Sodium: 142 mmol/L (ref 134–144)
Total Protein: 7.9 g/dL (ref 6.0–8.5)

## 2015-11-26 LAB — HEMOGLOBIN A1C
Est. average glucose Bld gHb Est-mCnc: 126 mg/dL
Hgb A1c MFr Bld: 6 % — ABNORMAL HIGH (ref 4.8–5.6)

## 2015-11-26 NOTE — Telephone Encounter (Signed)
Patient advised as below.  

## 2015-11-26 NOTE — Telephone Encounter (Signed)
-----   Message from Margaretann LovelessJennifer M Burnette, PA-C sent at 11/26/2015  8:26 AM EDT ----- All labs are within normal limits and stable.  Thanks! -JB

## 2016-01-23 ENCOUNTER — Other Ambulatory Visit: Payer: Self-pay | Admitting: Family Medicine

## 2016-01-23 DIAGNOSIS — J309 Allergic rhinitis, unspecified: Secondary | ICD-10-CM

## 2016-04-28 ENCOUNTER — Other Ambulatory Visit: Payer: Self-pay | Admitting: Physician Assistant

## 2016-04-28 DIAGNOSIS — E7800 Pure hypercholesterolemia, unspecified: Secondary | ICD-10-CM

## 2016-04-28 NOTE — Telephone Encounter (Signed)
Last ov 11/25/15  Last filled 10/29/15 Please review. Thank you. sd

## 2016-05-11 ENCOUNTER — Other Ambulatory Visit: Payer: Self-pay | Admitting: Physician Assistant

## 2016-05-11 DIAGNOSIS — Z1231 Encounter for screening mammogram for malignant neoplasm of breast: Secondary | ICD-10-CM

## 2016-06-02 ENCOUNTER — Telehealth: Payer: Self-pay

## 2016-06-02 ENCOUNTER — Ambulatory Visit
Admission: RE | Admit: 2016-06-02 | Discharge: 2016-06-02 | Disposition: A | Discharge status: Home / Self Care | Payer: Medicare Other | Source: Ambulatory Visit | Attending: Physician Assistant | Admitting: Physician Assistant

## 2016-06-02 DIAGNOSIS — Z1231 Encounter for screening mammogram for malignant neoplasm of breast: Secondary | ICD-10-CM | POA: Diagnosis present

## 2016-06-02 NOTE — Telephone Encounter (Signed)
Patient has been advised. KW 

## 2016-06-02 NOTE — Telephone Encounter (Signed)
-----   Message from Margaretann LovelessJennifer M Burnette, [place]-C sent at 06/02/2016 12:29 PM EDT ----- Normal mammogram. Repeat screening in one year.

## 2016-08-15 ENCOUNTER — Encounter: Payer: Self-pay | Admitting: Physician Assistant

## 2016-08-15 ENCOUNTER — Ambulatory Visit (INDEPENDENT_AMBULATORY_CARE_PROVIDER_SITE_OTHER): Payer: Medicare Other | Admitting: Physician Assistant

## 2016-08-15 VITALS — BP 136/80 | HR 84 | Temp 98.2°F | Resp 16 | Wt 154.0 lb

## 2016-08-15 DIAGNOSIS — N3090 Cystitis, unspecified without hematuria: Secondary | ICD-10-CM | POA: Diagnosis not present

## 2016-08-15 DIAGNOSIS — J0100 Acute maxillary sinusitis, unspecified: Secondary | ICD-10-CM | POA: Diagnosis not present

## 2016-08-15 LAB — POCT URINALYSIS DIPSTICK
Bilirubin, UA: NEGATIVE
Glucose, UA: NEGATIVE
Ketones, UA: NEGATIVE
Leukocytes, UA: NEGATIVE
Nitrite, UA: NEGATIVE
PH UA: 7.5 (ref 5.0–8.0)
PROTEIN UA: NEGATIVE
SPEC GRAV UA: 1.01 (ref 1.010–1.025)
Urobilinogen, UA: 0.2 E.U./dL

## 2016-08-15 MED ORDER — SULFAMETHOXAZOLE-TRIMETHOPRIM 800-160 MG PO TABS: 1.0000 | ORAL_TABLET | Freq: Two times a day (BID) | ORAL | 0 refills | Status: DC

## 2016-08-15 NOTE — Patient Instructions (Signed)
Sulfamethoxazole; Trimethoprim, SMX-TMP tablets What is this medicine? SULFAMETHOXAZOLE; TRIMETHOPRIM or SMX-TMP (suhl fuh meth OK suh zohl; trye METH oh prim) is a combination of a sulfonamide antibiotic and a second antibiotic, trimethoprim. It is used to treat or prevent certain kinds of bacterial infections. It will not work for colds, flu, or other viral infections. This medicine may be used for other purposes; ask your health care provider or pharmacist if you have questions. COMMON BRAND NAME(S): Bacter-Aid DS, Bactrim, Bactrim DS, Septra, Septra DS What should I tell my health care provider before I take this medicine? They need to know if you have any of these conditions: -anemia -asthma -being treated with anticonvulsants -if you frequently drink alcohol containing drinks -kidney disease -liver disease -low level of folic acid or glucose-6-phosphate dehydrogenase -poor nutrition or malabsorption -porphyria -severe allergies -thyroid disorder -an unusual or allergic reaction to sulfamethoxazole, trimethoprim, sulfa drugs, other medicines, foods, dyes, or preservatives -pregnant or trying to get pregnant -breast-feeding How should I use this medicine? Take this medicine by mouth with a full glass of water. Follow the directions on the prescription label. Take your medicine at regular intervals. Do not take it more often than directed. Do not skip doses or stop your medicine early. Talk to your pediatrician regarding the use of this medicine in children. Special care may be needed. This medicine has been used in children as young as 2 months of age. Overdosage: If you think you have taken too much of this medicine contact a poison control center or emergency room at once. NOTE: This medicine is only for you. Do not share this medicine with others. What if I miss a dose? If you miss a dose, take it as soon as you can. If it is almost time for your next dose, take only that dose. Do  not take double or extra doses. What may interact with this medicine? Do not take this medicine with any of the following medications: -aminobenzoate potassium -dofetilide -metronidazole This medicine may also interact with the following medications: -ACE inhibitors like benazepril, enalapril, lisinopril, and ramipril -birth control pills -cyclosporine -digoxin -diuretics -indomethacin -medicines for diabetes -methenamine -methotrexate -phenytoin -potassium supplements -pyrimethamine -sulfinpyrazone -tricyclic antidepressants -warfarin This list may not describe all possible interactions. Give your health care provider a list of all the medicines, herbs, non-prescription drugs, or dietary supplements you use. Also tell them if you smoke, drink alcohol, or use illegal drugs. Some items may interact with your medicine. What should I watch for while using this medicine? Tell your doctor or health care professional if your symptoms do not improve. Drink several glasses of water a day to reduce the risk of kidney problems. Do not treat diarrhea with over the counter products. Contact your doctor if you have diarrhea that lasts more than 2 days or if it is severe and watery. This medicine can make you more sensitive to the sun. Keep out of the sun. If you cannot avoid being in the sun, wear protective clothing and use a sunscreen. Do not use sun lamps or tanning beds/booths. What side effects may I notice from receiving this medicine? Side effects that you should report to your doctor or health care professional as soon as possible: -allergic reactions like skin rash or hives, swelling of the face, lips, or tongue -breathing problems -fever or chills, sore throat -irregular heartbeat, chest pain -joint or muscle pain -pain or difficulty passing urine -red pinpoint spots on skin -redness, blistering, peeling or loosening of   the skin, including inside the mouth -unusual bleeding or  bruising -unusually weak or tired -yellowing of the eyes or skin Side effects that usually do not require medical attention (report to your doctor or health care professional if they continue or are bothersome): -diarrhea -dizziness -headache -loss of appetite -nausea, vomiting -nervousness This list may not describe all possible side effects. Call your doctor for medical advice about side effects. You may report side effects to FDA at 1-800-FDA-1088. Where should I keep my medicine? Keep out of the reach of children. Store at room temperature between 20 to 25 degrees C (68 to 77 degrees F). Protect from light. Throw away any unused medicine after the expiration date. NOTE: This sheet is a summary. It may not cover all possible information. If you have questions about this medicine, talk to your doctor, pharmacist, or health care provider.  2018 Elsevier/Gold Standard (2012-08-17 14:38:26)  

## 2016-08-15 NOTE — Progress Notes (Signed)
Patient: Alison Blackwell Female    DOB: 1940/12/25   76 y.o.   MRN: [id number] Visit Date: 08/15/2016  Today's Provider: Margaretann LovelessJennifer M Burnette, PA-C   Chief Complaint  Patient presents with  . Urinary Tract Infection    Symptoms started Friday.  . Sinusitis    Started about a week ago.   Subjective:    Urinary Tract Infection   This is a new problem. The current episode started in the past 7 days. The problem has been unchanged. There has been no fever. Associated symptoms include frequency. Pertinent negatives include no chills, flank pain, hematuria, nausea, urgency or vomiting.  Sinusitis  This is a new problem. The current episode started in the past 7 days. The problem has been gradually worsening (Especially this morning.) since onset. There has been no fever. Associated symptoms include congestion, coughing and sinus pressure. Pertinent negatives include no chills, diaphoresis, ear pain, headaches, shortness of breath, sneezing or sore throat.      No Known Allergies   Current Outpatient Prescriptions:  .  aspirin EC 81 MG tablet, Take 1 tablet by mouth daily., Disp: , Rfl:  .  atorvastatin (LIPITOR) 10 MG tablet, TAKE 1 TABLET BY MOUTH EVERY DAY, Disp: 90 tablet, Rfl: 1 .  fluticasone (FLONASE) 50 MCG/ACT nasal spray, Place 2 sprays into both nostrils daily., Disp: 16 g, Rfl: 5 .  meclizine (ANTIVERT) 25 MG tablet, Take 1 tablet (25 mg total) by mouth 3 (three) times daily as needed for dizziness., Disp: 30 tablet, Rfl: 0 .  montelukast (SINGULAIR) 10 MG tablet, TAKE 1 TABLET (10 MG TOTAL) BY MOUTH DAILY., Disp: 90 tablet, Rfl: 3 .  Multiple Vitamin (MULTI-VITAMINS) TABS, Take 1 tablet by mouth daily., Disp: , Rfl:  .  Omega 3-6-9 CAPS, Take 1 capsule by mouth daily., Disp: , Rfl:  .  SYNTHROID 50 MCG tablet, ONCE DAILY BY MOUTH DO NOT SUBSTITUTE PLEASE, Disp: , Rfl: 3  Review of Systems  Constitutional: Positive for fatigue. Negative for activity change,  appetite change, chills, diaphoresis, fever and unexpected weight change.  HENT: Positive for congestion, sinus pain and sinus pressure. Negative for ear discharge, ear pain, nosebleeds, postnasal drip, rhinorrhea, sneezing, sore throat, tinnitus, trouble swallowing and voice change.   Eyes: Negative.   Respiratory: Positive for cough. Negative for apnea, choking, chest tightness, shortness of breath, wheezing and stridor.   Gastrointestinal: Positive for abdominal pain. Negative for abdominal distention, anal bleeding, blood in stool, constipation, diarrhea, nausea, rectal pain and vomiting.  Genitourinary: Positive for frequency. Negative for decreased urine volume, difficulty urinating, dyspareunia, dysuria, enuresis, flank pain, genital sores, hematuria, menstrual problem, pelvic pain, urgency, vaginal bleeding, vaginal discharge and vaginal pain.  Musculoskeletal: Positive for back pain.  Neurological: Negative for dizziness, light-headedness and headaches.    Social History  Substance Use Topics  . Smoking status: Former Games developermoker  . Smokeless tobacco: Never Used  . Alcohol use Yes     Comment: Occasionally   Objective:   BP 136/80 (BP Location: Left Arm, Patient Position: Sitting, Cuff Size: Normal)   Pulse 84   Temp 98.2 F (36.8 C) (Oral)   Resp 16   Wt 154 lb (69.9 kg)   SpO2 99%   BMI 25.63 kg/m  Vitals:   08/15/16 0908  BP: 136/80  Pulse: 84  Resp: 16  Temp: 98.2 F (36.8 C)  TempSrc: Oral  SpO2: 99%  Weight: 154 lb (69.9 kg)  Physical Exam  Constitutional: She is oriented to person, place, and time. She appears well-developed and well-nourished. No distress.  HENT:  Head: Normocephalic and atraumatic.  Right Ear: Hearing, tympanic membrane, external ear and ear canal normal.  Left Ear: Hearing, tympanic membrane, external ear and ear canal normal.  Nose: Right sinus exhibits maxillary sinus tenderness. Right sinus exhibits no frontal sinus tenderness. Left  sinus exhibits maxillary sinus tenderness. Left sinus exhibits no frontal sinus tenderness.  Mouth/Throat: Uvula is midline, oropharynx is clear and moist and mucous membranes are normal. No oropharyngeal exudate.  Neck: Normal range of motion. Neck supple. No tracheal deviation present. No thyromegaly present.  Cardiovascular: Normal rate, regular rhythm and normal heart sounds.  Exam reveals no gallop and no friction rub.   No murmur heard. Pulmonary/Chest: Effort normal and breath sounds normal. No stridor. No respiratory distress. She has no wheezes. She has no rales.  Abdominal: Soft. Normal appearance and bowel sounds are normal. She exhibits no distension and no mass. There is no hepatosplenomegaly. There is tenderness in the suprapubic area. There is no rebound, no guarding and no CVA tenderness.  Suprapubic pressure  Lymphadenopathy:    She has no cervical adenopathy.  Neurological: She is alert and oriented to person, place, and time.  Skin: Skin is warm and dry. She is not diaphoretic.  Vitals reviewed.       Assessment & Plan:     1. Cystitis Worsening symptoms. UA positive. Will treat empirically with Bactrim as below. Continue to push fluids. Urine sent for culture. Will follow up pending C&S results. She is to call if symptoms do not improve or if they worsen.  - POCT urinalysis dipstick - Urine Culture - sulfamethoxazole-trimethoprim (BACTRIM DS,SEPTRA DS) 800-160 MG tablet; Take 1 tablet by mouth 2 (two) times daily.  Dispense: 20 tablet; Refill: 0  2. Acute maxillary sinusitis, recurrence not specified Worsening symptoms that have not responded to OTC medications. Will give Bactrim as below. Continue allergy medications. Stay well hydrated and get plenty of rest. Call if no symptom improvement or if symptoms worsen. - sulfamethoxazole-trimethoprim (BACTRIM DS,SEPTRA DS) 800-160 MG tablet; Take 1 tablet by mouth 2 (two) times daily.  Dispense: 20 tablet; Refill: 0        Margaretann Loveless, PA-C  [facility]

## 2016-08-17 LAB — URINE CULTURE: Organism ID, Bacteria: NO GROWTH

## 2016-09-17 ENCOUNTER — Encounter: Payer: Self-pay | Admitting: Physician Assistant

## 2016-09-17 ENCOUNTER — Ambulatory Visit (INDEPENDENT_AMBULATORY_CARE_PROVIDER_SITE_OTHER): Payer: Medicare Other | Admitting: Physician Assistant

## 2016-09-17 VITALS — BP 130/70 | HR 78 | Temp 98.3°F | Resp 16 | Wt 155.6 lb

## 2016-09-17 DIAGNOSIS — J0100 Acute maxillary sinusitis, unspecified: Secondary | ICD-10-CM

## 2016-09-17 MED ORDER — AMOXICILLIN-POT CLAVULANATE 875-125 MG PO TABS: 1.0000 | ORAL_TABLET | Freq: Two times a day (BID) | ORAL | 0 refills | Status: DC

## 2016-09-17 NOTE — Patient Instructions (Signed)

## 2016-09-17 NOTE — Progress Notes (Signed)
Patient: Alison Blackwell Female    DOB: 07-14-1940   76 y.o.   MRN: [id number] Visit Date: 09/17/2016  Today's Provider: Margaretann Loveless, PA-C   Chief Complaint  Patient presents with  . URI   Subjective:    HPI Upper Respiratory Infection: Patient complains of symptoms of a URI, possible sinusitis. Symptoms include congestion and plugged sensation in both ears. Onset of symptoms was 10 days ago, gradually worsening since that time. She also c/o facial pain for the past 5 days .  She is drinking plenty of fluids. Evaluation to date: none. Treatment to date: decongestants (mucinex).    No Known Allergies   Current Outpatient Prescriptions:  .  aspirin EC 81 MG tablet, Take 1 tablet by mouth daily., Disp: , Rfl:  .  atorvastatin (LIPITOR) 10 MG tablet, TAKE 1 TABLET BY MOUTH EVERY DAY, Disp: 90 tablet, Rfl: 1 .  fluticasone (FLONASE) 50 MCG/ACT nasal spray, Place 2 sprays into both nostrils daily., Disp: 16 g, Rfl: 5 .  montelukast (SINGULAIR) 10 MG tablet, TAKE 1 TABLET (10 MG TOTAL) BY MOUTH DAILY., Disp: 90 tablet, Rfl: 3 .  Multiple Vitamin (MULTI-VITAMINS) TABS, Take 1 tablet by mouth daily., Disp: , Rfl:  .  Omega 3-6-9 CAPS, Take 1 capsule by mouth daily., Disp: , Rfl:  .  SYNTHROID 50 MCG tablet, ONCE DAILY BY MOUTH DO NOT SUBSTITUTE PLEASE, Disp: , Rfl: 3  Review of Systems  Constitutional: Negative for fatigue and fever.  HENT: Positive for congestion, postnasal drip, rhinorrhea, sinus pain and sinus pressure. Negative for ear pain, sneezing, sore throat and trouble swallowing.   Respiratory: Negative for cough, chest tightness and shortness of breath.   Cardiovascular: Negative for chest pain, palpitations and leg swelling.  Gastrointestinal: Negative for abdominal pain and nausea.  Neurological: Positive for headaches. Negative for dizziness.    Social History  Substance Use Topics  . Smoking status: Former Games developer  . Smokeless tobacco: Never Used  .  Alcohol use Yes     Comment: Occasionally   Objective:   BP 130/70 (BP Location: Right Arm, Patient Position: Sitting, Cuff Size: Large)   Pulse 78   Temp 98.3 F (36.8 C) (Oral)   Resp 16   Wt 155 lb 9.6 oz (70.6 kg)   SpO2 96%   BMI 25.89 kg/m  Vitals:   09/17/16 0955  BP: 130/70  Pulse: 78  Resp: 16  Temp: 98.3 F (36.8 C)  TempSrc: Oral  SpO2: 96%  Weight: 155 lb 9.6 oz (70.6 kg)     Physical Exam  Constitutional: She appears well-developed and well-nourished. No distress.  HENT:  Head: Normocephalic and atraumatic.  Right Ear: Hearing, tympanic membrane, external ear and ear canal normal.  Left Ear: Hearing, tympanic membrane, external ear and ear canal normal.  Nose: Right sinus exhibits maxillary sinus tenderness. Right sinus exhibits no frontal sinus tenderness. Left sinus exhibits maxillary sinus tenderness. Left sinus exhibits no frontal sinus tenderness.  Mouth/Throat: Uvula is midline, oropharynx is clear and moist and mucous membranes are normal. No oropharyngeal exudate.  Neck: Normal range of motion. Neck supple. No tracheal deviation present. No thyromegaly present.  Cardiovascular: Normal rate, regular rhythm and normal heart sounds.  Exam reveals no gallop and no friction rub.   No murmur heard. Pulmonary/Chest: Effort normal and breath sounds normal. No stridor. No respiratory distress. She has no wheezes. She has no rales.  Lymphadenopathy:    She has no  cervical adenopathy.  Skin: She is not diaphoretic.  Vitals reviewed.      Assessment & Plan:     1. Acute non-recurrent maxillary sinusitis Worsening symptoms that have not responded to OTC medications. Will give augmentin as below. Continue allergy medications. Stay well hydrated and get plenty of rest. Call if no symptom improvement or if symptoms worsen. - amoxicillin-clavulanate (AUGMENTIN) 875-125 MG tablet; Take 1 tablet by mouth 2 (two) times daily.  Dispense: 20 tablet; Refill: 0        Margaretann Loveless, PA-C  [facility]

## 2016-10-26 ENCOUNTER — Other Ambulatory Visit: Payer: Self-pay | Admitting: Physician Assistant

## 2016-10-26 DIAGNOSIS — E7800 Pure hypercholesterolemia, unspecified: Secondary | ICD-10-CM

## 2016-11-01 ENCOUNTER — Telehealth: Payer: Self-pay | Admitting: Physician Assistant

## 2016-11-05 ENCOUNTER — Ambulatory Visit: Payer: Medicare Other

## 2016-11-09 ENCOUNTER — Ambulatory Visit (INDEPENDENT_AMBULATORY_CARE_PROVIDER_SITE_OTHER): Payer: Medicare Other

## 2016-11-09 DIAGNOSIS — Z23 Encounter for immunization: Secondary | ICD-10-CM

## 2016-11-10 ENCOUNTER — Ambulatory Visit (INDEPENDENT_AMBULATORY_CARE_PROVIDER_SITE_OTHER): Payer: Medicare Other | Admitting: Physician Assistant

## 2016-11-10 ENCOUNTER — Encounter: Payer: Self-pay | Admitting: Physician Assistant

## 2016-11-10 VITALS — BP 132/70 | HR 88 | Temp 98.2°F | Resp 16 | Wt 152.2 lb

## 2016-11-10 DIAGNOSIS — S29012A Strain of muscle and tendon of back wall of thorax, initial encounter: Secondary | ICD-10-CM | POA: Diagnosis not present

## 2016-11-10 MED ORDER — CYCLOBENZAPRINE HCL 5 MG PO TABS: 2.5000 mg | ORAL_TABLET | Freq: Every day | ORAL | 0 refills | Status: DC

## 2016-11-10 MED ORDER — MELOXICAM 7.5 MG PO TABS: 7.5000 mg | ORAL_TABLET | Freq: Every day | ORAL | 0 refills | Status: DC

## 2016-11-10 NOTE — Patient Instructions (Signed)
Cyclobenzaprine half a tab or a whole tab at bedtime Meloxicam 7.5mg  in morning with breakfast Heating pad 10-15 min at a time Back massage stick or back buddy for self massage if wanted Can also do actual massage Call if no improvement

## 2016-11-10 NOTE — Progress Notes (Signed)
Patient: Alison Blackwell Female    DOB: Dec 26, 1940   76 y.o.   MRN: [id number] Visit Date: 11/10/2016  Today's Provider: Margaretann LovelessJennifer M Kavion Mancinas, PA-C   Chief Complaint  Patient presents with  . Shoulder Pain   Subjective:    HPI Patient comes in today c/o pain in her right shoulder/back. She reports that she has had the pain X 1 week. She thinks that wearing her purse may have aggravated her symptoms. She has been taking ibuprofen and Tylenol with minimal relief.  She is having pain and trigger points noted in the right rhomboid muscle group.    No Known Allergies   Current Outpatient Prescriptions:  .  aspirin EC 81 MG tablet, Take 1 tablet by mouth daily., Disp: , Rfl:  .  atorvastatin (LIPITOR) 10 MG tablet, TAKE 1 TABLET BY MOUTH EVERY DAY, Disp: 90 tablet, Rfl: 1 .  fluticasone (FLONASE) 50 MCG/ACT nasal spray, Place 2 sprays into both nostrils daily., Disp: 16 g, Rfl: 5 .  montelukast (SINGULAIR) 10 MG tablet, TAKE 1 TABLET (10 MG TOTAL) BY MOUTH DAILY., Disp: 90 tablet, Rfl: 3 .  Multiple Vitamin (MULTI-VITAMINS) TABS, Take 1 tablet by mouth daily., Disp: , Rfl:  .  Omega 3-6-9 CAPS, Take 1 capsule by mouth daily., Disp: , Rfl:  .  SYNTHROID 50 MCG tablet, ONCE DAILY BY MOUTH DO NOT SUBSTITUTE PLEASE, Disp: , Rfl: 3 .  amoxicillin-clavulanate (AUGMENTIN) 875-125 MG tablet, Take 1 tablet by mouth 2 (two) times daily. (Patient not taking: Reported on 11/10/2016), Disp: 20 tablet, Rfl: 0  Review of Systems  Constitutional: Negative for activity change, appetite change and fatigue.  Respiratory: Negative.   Cardiovascular: Negative.   Gastrointestinal: Negative.   Musculoskeletal: Positive for arthralgias, myalgias, neck pain and neck stiffness. Negative for back pain, gait problem and joint swelling.  Neurological: Negative for dizziness, weakness, numbness and headaches.    Social History  Substance Use Topics  . Smoking status: Former Games developermoker  . Smokeless  tobacco: Never Used  . Alcohol use Yes     Comment: Occasionally   Objective:   BP 132/70 (BP Location: Left Arm, Patient Position: Sitting, Cuff Size: Normal)   Pulse 88   Temp 98.2 F (36.8 C)   Resp 16   Wt 152 lb 3.2 oz (69 kg)   BMI 25.33 kg/m  Vitals:   11/10/16 0846  BP: 132/70  Pulse: 88  Resp: 16  Temp: 98.2 F (36.8 C)  Weight: 152 lb 3.2 oz (69 kg)     Physical Exam  Constitutional: She appears well-developed and well-nourished. No distress.  Neck: Normal range of motion. Neck supple.  Cardiovascular: Normal rate, regular rhythm and normal heart sounds.  Exam reveals no gallop and no friction rub.   No murmur heard. Pulmonary/Chest: Effort normal and breath sounds normal. No respiratory distress. She has no wheezes. She has no rales.  Musculoskeletal:       Right shoulder: Normal.       Left shoulder: Normal.       Cervical back: Normal.       Back:  Skin: She is not diaphoretic.  Vitals reviewed.       Assessment & Plan:     1. Strain of rhomboid muscle, initial encounter Will treat with meloxicam and flexeril as below. Discussed heating pad and massage stick for self massage to work out trigger points. She is to call if symptoms worsen.  - meloxicam (MOBIC)  7.5 MG tablet; Take 1 tablet (7.5 mg total) by mouth daily.  Dispense: 30 tablet; Refill: 0 - cyclobenzaprine (FLEXERIL) 5 MG tablet; Take 0.5-1 tablets (2.5-5 mg total) by mouth at bedtime.  Dispense: 30 tablet; Refill: 0       Margaretann Loveless, PA-C  [facility]

## 2016-11-25 ENCOUNTER — Ambulatory Visit (INDEPENDENT_AMBULATORY_CARE_PROVIDER_SITE_OTHER): Payer: Medicare Other | Admitting: Physician Assistant

## 2016-11-25 ENCOUNTER — Encounter: Payer: Self-pay | Admitting: Physician Assistant

## 2016-11-25 VITALS — BP 136/86 | HR 78 | Temp 98.1°F | Resp 16 | Ht 65.0 in | Wt 153.0 lb

## 2016-11-25 DIAGNOSIS — E559 Vitamin D deficiency, unspecified: Secondary | ICD-10-CM

## 2016-11-25 DIAGNOSIS — E7800 Pure hypercholesterolemia, unspecified: Secondary | ICD-10-CM

## 2016-11-25 DIAGNOSIS — I10 Essential (primary) hypertension: Secondary | ICD-10-CM | POA: Diagnosis not present

## 2016-11-25 DIAGNOSIS — E890 Postprocedural hypothyroidism: Secondary | ICD-10-CM

## 2016-11-25 DIAGNOSIS — M8589 Other specified disorders of bone density and structure, multiple sites: Secondary | ICD-10-CM

## 2016-11-25 DIAGNOSIS — Z0000 Encounter for general adult medical examination without abnormal findings: Secondary | ICD-10-CM | POA: Diagnosis not present

## 2016-11-25 DIAGNOSIS — R7309 Other abnormal glucose: Secondary | ICD-10-CM | POA: Diagnosis not present

## 2016-11-25 NOTE — Patient Instructions (Signed)
Health Maintenance for Postmenopausal Women Menopause is a normal process in which your reproductive ability comes to an end. This process happens gradually over a span of months to years, usually between the ages of 22 and 9. Menopause is complete when you have missed 12 consecutive menstrual periods. It is important to talk with your health care provider about some of the most common conditions that affect postmenopausal women, such as heart disease, cancer, and bone loss (osteoporosis). Adopting a healthy lifestyle and getting preventive care can help to promote your health and wellness. Those actions can also lower your chances of developing some of these common conditions. What should I know about menopause? During menopause, you may experience a number of symptoms, such as:  Moderate-to-severe hot flashes.  Night sweats.  Decrease in sex drive.  Mood swings.  Headaches.  Tiredness.  Irritability.  Memory problems.  Insomnia.  Choosing to treat or not to treat menopausal changes is an individual decision that you make with your health care provider. What should I know about hormone replacement therapy and supplements? Hormone therapy products are effective for treating symptoms that are associated with menopause, such as hot flashes and night sweats. Hormone replacement carries certain risks, especially as you become older. If you are thinking about using estrogen or estrogen with progestin treatments, discuss the benefits and risks with your health care provider. What should I know about heart disease and stroke? Heart disease, heart attack, and stroke become more likely as you age. This may be due, in part, to the hormonal changes that your body experiences during menopause. These can affect how your body processes dietary fats, triglycerides, and cholesterol. Heart attack and stroke are both medical emergencies. There are many things that you can do to help prevent heart disease  and stroke:  Have your blood pressure checked at least every 1-2 years. High blood pressure causes heart disease and increases the risk of stroke.  If you are 53-22 years old, ask your health care provider if you should take aspirin to prevent a heart attack or a stroke.  Do not use any tobacco products, including cigarettes, chewing tobacco, or electronic cigarettes. If you need help quitting, ask your health care provider.  It is important to eat a healthy diet and maintain a healthy weight. ? Be sure to include plenty of vegetables, fruits, low-fat dairy products, and lean protein. ? Avoid eating foods that are high in solid fats, added sugars, or salt (sodium).  Get regular exercise. This is one of the most important things that you can do for your health. ? Try to exercise for at least 150 minutes each week. The type of exercise that you do should increase your heart rate and make you sweat. This is known as moderate-intensity exercise. ? Try to do strengthening exercises at least twice each week. Do these in addition to the moderate-intensity exercise.  Know your numbers.Ask your health care provider to check your cholesterol and your blood glucose. Continue to have your blood tested as directed by your health care provider.  What should I know about cancer screening? There are several types of cancer. Take the following steps to reduce your risk and to catch any cancer development as early as possible. Breast Cancer  Practice breast self-awareness. ? This means understanding how your breasts normally appear and feel. ? It also means doing regular breast self-exams. Let your health care provider know about any changes, no matter how small.  If you are 40  or older, have a clinician do a breast exam (clinical breast exam or CBE) every year. Depending on your age, family history, and medical history, it may be recommended that you also have a yearly breast X-ray (mammogram).  If you  have a family history of breast cancer, talk with your health care provider about genetic screening.  If you are at high risk for breast cancer, talk with your health care provider about having an MRI and a mammogram every year.  Breast cancer (BRCA) gene test is recommended for women who have family members with BRCA-related cancers. Results of the assessment will determine the need for genetic counseling and BRCA1 and for BRCA2 testing. BRCA-related cancers include these types: ? Breast. This occurs in males or females. ? Ovarian. ? Tubal. This may also be called fallopian tube cancer. ? Cancer of the abdominal or pelvic lining (peritoneal cancer). ? Prostate. ? Pancreatic.  Cervical, Uterine, and Ovarian Cancer Your health care provider may recommend that you be screened regularly for cancer of the pelvic organs. These include your ovaries, uterus, and vagina. This screening involves a pelvic exam, which includes checking for microscopic changes to the surface of your cervix (Pap test).  For women ages 21-65, health care providers may recommend a pelvic exam and a Pap test every three years. For women ages 79-65, they may recommend the Pap test and pelvic exam, combined with testing for human papilloma virus (HPV), every five years. Some types of HPV increase your risk of cervical cancer. Testing for HPV may also be done on women of any age who have unclear Pap test results.  Other health care providers may not recommend any screening for nonpregnant women who are considered low risk for pelvic cancer and have no symptoms. Ask your health care provider if a screening pelvic exam is right for you.  If you have had past treatment for cervical cancer or a condition that could lead to cancer, you need Pap tests and screening for cancer for at least 20 years after your treatment. If Pap tests have been discontinued for you, your risk factors (such as having a new sexual partner) need to be  reassessed to determine if you should start having screenings again. Some women have medical problems that increase the chance of getting cervical cancer. In these cases, your health care provider may recommend that you have screening and Pap tests more often.  If you have a family history of uterine cancer or ovarian cancer, talk with your health care provider about genetic screening.  If you have vaginal bleeding after reaching menopause, tell your health care provider.  There are currently no reliable tests available to screen for ovarian cancer.  Lung Cancer Lung cancer screening is recommended for adults 69-62 years old who are at high risk for lung cancer because of a history of smoking. A yearly low-dose CT scan of the lungs is recommended if you:  Currently smoke.  Have a history of at least 30 pack-years of smoking and you currently smoke or have quit within the past 15 years. A pack-year is smoking an average of one pack of cigarettes per day for one year.  Yearly screening should:  Continue until it has been 15 years since you quit.  Stop if you develop a health problem that would prevent you from having lung cancer treatment.  Colorectal Cancer  This type of cancer can be detected and can often be prevented.  Routine colorectal cancer screening usually begins at  age 42 and continues through age 45.  If you have risk factors for colon cancer, your health care provider may recommend that you be screened at an earlier age.  If you have a family history of colorectal cancer, talk with your health care provider about genetic screening.  Your health care provider may also recommend using home test kits to check for hidden blood in your stool.  A small camera at the end of a tube can be used to examine your colon directly (sigmoidoscopy or colonoscopy). This is done to check for the earliest forms of colorectal cancer.  Direct examination of the colon should be repeated every  5-10 years until age 71. However, if early forms of precancerous polyps or small growths are found or if you have a family history or genetic risk for colorectal cancer, you may need to be screened more often.  Skin Cancer  Check your skin from head to toe regularly.  Monitor any moles. Be sure to tell your health care provider: ? About any new moles or changes in moles, especially if there is a change in a mole's shape or color. ? If you have a mole that is larger than the size of a pencil eraser.  If any of your family members has a history of skin cancer, especially at a young age, talk with your health care provider about genetic screening.  Always use sunscreen. Apply sunscreen liberally and repeatedly throughout the day.  Whenever you are outside, protect yourself by wearing long sleeves, pants, a wide-brimmed hat, and sunglasses.  What should I know about osteoporosis? Osteoporosis is a condition in which bone destruction happens more quickly than new bone creation. After menopause, you may be at an increased risk for osteoporosis. To help prevent osteoporosis or the bone fractures that can happen because of osteoporosis, the following is recommended:  If you are 46-71 years old, get at least 1,000 mg of calcium and at least 600 mg of vitamin D per day.  If you are older than age 55 but younger than age 65, get at least 1,200 mg of calcium and at least 600 mg of vitamin D per day.  If you are older than age 54, get at least 1,200 mg of calcium and at least 800 mg of vitamin D per day.  Smoking and excessive alcohol intake increase the risk of osteoporosis. Eat foods that are rich in calcium and vitamin D, and do weight-bearing exercises several times each week as directed by your health care provider. What should I know about how menopause affects my mental health? Depression may occur at any age, but it is more common as you become older. Common symptoms of depression  include:  Low or sad mood.  Changes in sleep patterns.  Changes in appetite or eating patterns.  Feeling an overall lack of motivation or enjoyment of activities that you previously enjoyed.  Frequent crying spells.  Talk with your health care provider if you think that you are experiencing depression. What should I know about immunizations? It is important that you get and maintain your immunizations. These include:  Tetanus, diphtheria, and pertussis (Tdap) booster vaccine.  Influenza every year before the flu season begins.  Pneumonia vaccine.  Shingles vaccine.  Your health care provider may also recommend other immunizations. This information is not intended to replace advice given to you by your health care provider. Make sure you discuss any questions you have with your health care provider. Document Released: 03/04/2005  Document Revised: 07/31/2015 Document Reviewed: 10/14/2014 Elsevier Interactive Patient Education  2018 [employer] Inc[employer]  

## 2016-11-25 NOTE — Progress Notes (Signed)
Patient: Alison Blackwell, Female    DOB: 22-Feb-1940, 76 y.o.   MRN: [id number] Visit Date: 11/25/2016  Today's Provider: Margaretann Loveless, PA-C   Chief Complaint  Patient presents with  . Annual Exam   Subjective:    Annual wellness visit Alison Blackwell is a 76 y.o. female. She feels well. She reports exercising daily 30 minutes of walking. She reports she is sleeping well.  11/25/15 AWE 02/13/14 Colonoscopy-Diverticulosis 06/02/16 Mammogram-BI-RADS 1 -----------------------------------------------------------   Review of Systems  Constitutional: Negative.   HENT: Positive for sinus pressure.   Eyes: Negative.   Respiratory: Negative.   Cardiovascular: Negative.   Gastrointestinal: Negative.   Endocrine: Negative.   Genitourinary: Negative.   Musculoskeletal: Positive for back pain and myalgias.  Skin: Negative.   Allergic/Immunologic: Positive for environmental allergies.  Neurological: Negative.   Hematological: Negative.   Psychiatric/Behavioral: Negative.     Social History   Social History  . Marital status: Divorced    Spouse name: N/A  . Number of children: 3  . Years of education: College   Occupational History  . Retired    Social History Main Topics  . Smoking status: Former Games developer  . Smokeless tobacco: Never Used  . Alcohol use Yes     Comment: Occasionally  . Drug use: No  . Sexual activity: Not on file   Other Topics Concern  . Not on file   Social History Narrative  . No narrative on file    No past medical history on file.   Patient Active Problem List   Diagnosis Date Noted  . Cystitis 03/17/2015  . Vertigo 03/17/2015  . Osteopenia 11/24/2014  . Essential (primary) hypertension 11/21/2014  . Blood in the urine 11/21/2014  . Calcium blood increased 11/21/2014  . Awareness of heartbeats 11/21/2014  . Blood pressure elevated without history of HTN 11/21/2014  . Fast heart beat 11/21/2014  . Avitaminosis D  11/21/2014  . Abnormal blood sugar 06/08/2009  . Allergic rhinitis 12/12/2007  . 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 Gastrointestinal ulcer due to Helicobacter pylori 06/07/2002  . Acid reflux 05/17/2002  . Hypercholesteremia 04/08/1998  . Hypothyroidism, postop 08/15/1991    Past Surgical History:  Procedure Laterality Date  . CHOLECYSTECTOMY  2002  . THYROIDECTOMY  1990    Her family history includes Breast cancer in her maternal aunt; Breast cancer (age of onset: 86) in her daughter; Breast cancer (age of onset: 64) in her maternal aunt; Healthy in her daughter, sister, sister, and son; Lung cancer in her mother.      Current Outpatient Prescriptions:  .  aspirin EC 81 MG tablet, Take 1 tablet by mouth daily., Disp: , Rfl:  .  atorvastatin (LIPITOR) 10 MG tablet, TAKE 1 TABLET BY MOUTH EVERY DAY, Disp: 90 tablet, Rfl: 1 .  cyclobenzaprine (FLEXERIL) 5 MG tablet, Take 0.5-1 tablets (2.5-5 mg total) by mouth at bedtime., Disp: 30 tablet, Rfl: 0 .  fluticasone (FLONASE) 50 MCG/ACT nasal spray, Place 2 sprays into both nostrils daily., Disp: 16 g, Rfl: 5 .  meloxicam (MOBIC) 7.5 MG tablet, Take 1 tablet (7.5 mg total) by mouth daily., Disp: 30 tablet, Rfl: 0 .  Multiple Vitamin (MULTI-VITAMINS) TABS, Take 1 tablet by mouth daily., Disp: , Rfl:  .  Omega 3-6-9 CAPS, Take 1 capsule by mouth daily., Disp: , Rfl:  .  SYNTHROID 50 MCG tablet, ONCE DAILY BY MOUTH DO NOT SUBSTITUTE PLEASE, Disp: , Rfl: 3  Patient  Care Team: Margaretann LovelessBurnette, Jennifer M, PA-C as PCP - General (Family Medicine)     Objective:   Vitals: BP 136/86 (BP Location: Left Arm, Patient Position: Sitting, Cuff Size: Normal)   Pulse 78   Temp 98.1 F (36.7 C) (Oral)   Resp 16   Ht 5\' 5"  (1.651 m)   Wt 153 lb (69.4 kg)   SpO2 99%   BMI 25.46 kg/m   Physical Exam  Constitutional: She is oriented to person, place, and time. She appears well-developed and well-nourished. No distress.  HENT:  Head: Normocephalic and  atraumatic.  Right Ear: Hearing, tympanic membrane, external ear and ear canal normal.  Left Ear: Hearing, tympanic membrane, external ear and ear canal normal.  Nose: Nose normal.  Mouth/Throat: Uvula is midline, oropharynx is clear and moist and mucous membranes are normal. No oropharyngeal exudate, posterior oropharyngeal edema or posterior oropharyngeal erythema.  Eyes: Pupils are equal, round, and reactive to light. Conjunctivae and EOM are normal. Right eye exhibits no discharge. Left eye exhibits no discharge. No scleral icterus.  Neck: Normal range of motion. Neck supple. No JVD present. Carotid bruit is not present. No tracheal deviation present. No thyromegaly present.  Cardiovascular: Normal rate, regular rhythm, normal heart sounds and intact distal pulses.  Exam reveals no gallop and no friction rub.   No murmur heard. Pulmonary/Chest: Effort normal and breath sounds normal. No respiratory distress. She has no wheezes. She has no rales. She exhibits no tenderness.  Abdominal: Soft. Bowel sounds are normal. She exhibits no distension and no mass. There is no tenderness. There is no rebound and no guarding.  Musculoskeletal: Normal range of motion. She exhibits no edema or tenderness.  Lymphadenopathy:    She has no cervical adenopathy.  Neurological: She is alert and oriented to person, place, and time.  Skin: Skin is warm and dry. No rash noted. She is not diaphoretic.  Psychiatric: She has a normal mood and affect. Her behavior is normal. Judgment and thought content normal.  Vitals reviewed.   Activities of Daily Living In your present state of health, do you have any difficulty performing the following activities: 11/25/2016  Hearing? N  Vision? N  Difficulty concentrating or making decisions? N  Walking or climbing stairs? N  Dressing or bathing? N  Doing errands, shopping? N  Some recent data might be hidden    Fall Risk Assessment Fall Risk  11/25/2016 11/25/2015  11/24/2014  Falls in the past year? No No No     Depression Screen PHQ 2/9 Scores 11/25/2016 11/25/2015 11/24/2014  PHQ - 2 Score 0 0 0  PHQ- 9 Score 0 - -    Cognitive Testing - 6-CIT  Correct? Score   What year is it? yes 0 0 or 4  What month is it? yes 0 0 or 3  Memorize:    [place],  [place],  [birth year],  [address],  [place],      What time is it? (within 1 hour) yes 0 0 or 3  Count backwards from 20 yes 0 0, 2, or 4  Name the months of the year yes 0 0, 2, or 4  Repeat name & address above no 2 0, 2, 4, 6, 8, or 10       TOTAL SCORE  2/28   Interpretation:  Normal  Normal (0-7) Abnormal (8-28)    Audit-C Alcohol Use Screening  Question Answer Points  How often do you have alcoholic drink? never 0  On days  you do drink alcohol, how many drinks do you typically consume? 0 0  How oftey will you drink 6 or more in a total? never 0  Total Score:  0   A score of 3 or more in women, and 4 or more in men indicates increased risk for alcohol abuse, EXCEPT if all of the points are from question 1.   Assessment & Plan:     Annual Wellness Visit  Reviewed patient's Family Medical History Reviewed and updated list of patient's medical providers Assessment of cognitive impairment was done Assessed patient's functional ability Established a written schedule for health screening services Health Risk Assessent Completed and Reviewed  Exercise Activities and Dietary recommendations Goals    None      Immunization History  Administered Date(s) Administered  . Influenza, High Dose Seasonal PF 11/08/2014, 11/06/2015, 11/09/2016  . Pneumococcal Conjugate-13 11/02/2013  . Pneumococcal Polysaccharide-23 11/17/2007  . Td 05/14/2008  . Zoster 11/22/2007    Health Maintenance  Topic Date Due  . Janet Berlin  05/15/2018  . COLONOSCOPY  02/14/2024  . INFLUENZA VACCINE  Completed  . DEXA SCAN  Completed  . PNA vac Low Risk Adult  Completed     Discussed health benefits of physical  activity, and encouraged her to engage in regular exercise appropriate for her age and condition.    1. Medicare annual wellness visit, subsequent Normal physical exam. All vaccinations and screenings are up to date.  2. Essential (primary) hypertension Stable. Will check labs as below and f/u pending results. - CBC with Differential/Platelet - Comprehensive metabolic panel  3. Hypercholesteremia Stable on atorvastatin 10mg . Will check labs as below and f/u pending results. - Lipid panel  4. Abnormal blood sugar Will check labs as below and f/u pending results. - Hemoglobin A1c  5. Osteopenia of multiple sites Noted on last BMD from 2016. Will check labs as below and f/u pending results. - Vitamin D (25 hydroxy)  6. Avitaminosis D H/O this with known osteopenia. Will check labs as below and f/u pending results. - Vitamin D (25 hydroxy)  7. 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 Has been stable on Synthorid . Will check labs as below and f/u pending results. - TSH  ------------------------------------------------------------------------------------------------------------    Margaretann Loveless, PA-C  [facility]

## 2016-11-26 ENCOUNTER — Telehealth: Payer: Self-pay

## 2016-11-26 LAB — CBC WITH DIFFERENTIAL/PLATELET
BASOS ABS: 61 {cells}/uL (ref 0–200)
Basophils Relative: 0.9 %
EOS PCT: 3.8 %
Eosinophils Absolute: 258 cells/uL (ref 15–500)
HCT: 41.9 % (ref 35.0–45.0)
HEMOGLOBIN: 14.5 g/dL (ref 11.7–15.5)
LYMPHS ABS: 1326 {cells}/uL (ref 850–3900)
MCH: 30.9 pg (ref 27.0–33.0)
MCHC: 34.6 g/dL (ref 32.0–36.0)
MCV: 89.1 fL (ref 80.0–100.0)
MPV: 11.1 fL (ref 7.5–12.5)
Monocytes Relative: 8.1 %
NEUTROS PCT: 67.7 %
Neutro Abs: 4604 cells/uL (ref 1500–7800)
PLATELETS: 234 10*3/uL (ref 140–400)
RBC: 4.7 10*6/uL (ref 3.80–5.10)
RDW: 12.4 % (ref 11.0–15.0)
TOTAL LYMPHOCYTE: 19.5 %
WBC mixed population: 551 cells/uL (ref 200–950)
WBC: 6.8 10*3/uL (ref 3.8–10.8)

## 2016-11-26 LAB — COMPLETE METABOLIC PANEL WITH GFR
AG Ratio: 1.6 (calc) (ref 1.0–2.5)
ALBUMIN MSPROF: 4.4 g/dL (ref 3.6–5.1)
ALT: 17 U/L (ref 6–29)
AST: 19 U/L (ref 10–35)
Alkaline phosphatase (APISO): 71 U/L (ref 33–130)
BUN: 15 mg/dL (ref 7–25)
CALCIUM: 10.3 mg/dL (ref 8.6–10.4)
CO2: 30 mmol/L (ref 20–32)
CREATININE: 0.92 mg/dL (ref 0.60–0.93)
Chloride: 104 mmol/L (ref 98–110)
GFR, EST AFRICAN AMERICAN: 71 mL/min/{1.73_m2} (ref >=60)
GFR, EST NON AFRICAN AMERICAN: 61 mL/min/{1.73_m2} (ref >=60)
GLOBULIN: 2.7 g/dL (ref 1.9–3.7)
Glucose, Bld: 105 mg/dL — ABNORMAL HIGH (ref 65–99)
Potassium: 4.7 mmol/L (ref 3.5–5.3)
Sodium: 143 mmol/L (ref 135–146)
TOTAL PROTEIN: 7.1 g/dL (ref 6.1–8.1)
Total Bilirubin: 1.1 mg/dL (ref 0.2–1.2)

## 2016-11-26 LAB — LIPID PANEL
CHOL/HDL RATIO: 2.7 (calc) (ref <5.0)
CHOLESTEROL: 171 mg/dL (ref <200)
HDL: 64 mg/dL (ref >50)
LDL CHOLESTEROL (CALC): 87 mg/dL
Non-HDL Cholesterol (Calc): 107 mg/dL (calc) (ref <130)
Triglycerides: 106 mg/dL (ref <150)

## 2016-11-26 LAB — TSH: TSH: 0.28 mIU/L — ABNORMAL LOW (ref 0.40–4.50)

## 2016-11-26 LAB — HEMOGLOBIN A1C
Hgb A1c MFr Bld: 5.9 % of total Hgb — ABNORMAL HIGH (ref <5.7)
Mean Plasma Glucose: 123 (calc)
eAG (mmol/L): 6.8 (calc)

## 2016-11-26 LAB — VITAMIN D 25 HYDROXY (VIT D DEFICIENCY, FRACTURES): Vit D, 25-Hydroxy: 75 ng/mL (ref 30–100)

## 2016-11-26 NOTE — Telephone Encounter (Signed)
-----   Message from Margaretann LovelessJennifer M Burnette, PA-C sent at 11/26/2016  9:15 AM EDT ----- A1c improved to 5.9. TSH slightly overcorrected. Would recommend to decrease your Synthroid and recheck. All other labs are WNL.

## 2016-11-26 NOTE — Telephone Encounter (Signed)
Patient advised as below. Patient reports she will get in contact with her endocrinologist. sd

## 2016-12-16 ENCOUNTER — Other Ambulatory Visit: Payer: Self-pay | Admitting: Physician Assistant

## 2016-12-16 DIAGNOSIS — S29012A Strain of muscle and tendon of back wall of thorax, initial encounter: Secondary | ICD-10-CM

## 2017-02-14 ENCOUNTER — Telehealth: Payer: Self-pay

## 2017-02-14 NOTE — Telephone Encounter (Signed)
lmtcb for shingles vaccine.

## 2017-02-16 NOTE — Telephone Encounter (Signed)
Patient advised to call her insurance. sd

## 2017-02-17 ENCOUNTER — Other Ambulatory Visit: Payer: Self-pay | Admitting: Physician Assistant

## 2017-02-17 DIAGNOSIS — J309 Allergic rhinitis, unspecified: Secondary | ICD-10-CM

## 2017-02-20 ENCOUNTER — Telehealth: Payer: Self-pay | Admitting: Physician Assistant

## 2017-02-21 NOTE — Telephone Encounter (Signed)
duplicate

## 2017-04-18 ENCOUNTER — Other Ambulatory Visit: Payer: Self-pay | Admitting: Physician Assistant

## 2017-04-18 DIAGNOSIS — S29012A Strain of muscle and tendon of back wall of thorax, initial encounter: Secondary | ICD-10-CM

## 2017-04-18 NOTE — Telephone Encounter (Signed)
[facility] faxed a refill request for a 90-days supply for the following medication. Thanks CC ° °meloxicam (MOBIC) 7.5 MG tablet  ° °

## 2017-04-19 ENCOUNTER — Other Ambulatory Visit: Payer: Self-pay | Admitting: Physician Assistant

## 2017-04-19 DIAGNOSIS — S29012A Strain of muscle and tendon of back wall of thorax, initial encounter: Secondary | ICD-10-CM

## 2017-04-19 MED ORDER — MELOXICAM 7.5 MG PO TABS: 7.5000 mg | ORAL_TABLET | Freq: Every day | ORAL | 1 refills | Status: DC

## 2017-04-19 MED ORDER — MELOXICAM 7.5 MG PO TABS: 7.5000 mg | ORAL_TABLET | Freq: Every day | ORAL | 5 refills | Status: DC

## 2017-04-19 NOTE — Telephone Encounter (Signed)
[facility] faxed a refill request for a 90-days supply for the following medication. Thanks CC ° °meloxicam (MOBIC) 7.5 MG tablet  ° °

## 2017-04-21 ENCOUNTER — Other Ambulatory Visit: Payer: Self-pay | Admitting: Physician Assistant

## 2017-04-21 DIAGNOSIS — E7800 Pure hypercholesterolemia, unspecified: Secondary | ICD-10-CM

## 2017-04-25 ENCOUNTER — Other Ambulatory Visit: Payer: Self-pay | Admitting: Physician Assistant

## 2017-04-25 DIAGNOSIS — Z1231 Encounter for screening mammogram for malignant neoplasm of breast: Secondary | ICD-10-CM

## 2017-04-27 NOTE — Telephone Encounter (Signed)
complete

## 2017-06-06 ENCOUNTER — Ambulatory Visit
Admission: RE | Admit: 2017-06-06 | Discharge: 2017-06-06 | Disposition: A | Discharge status: Home / Self Care | Payer: Medicare Other | Source: Ambulatory Visit | Attending: Physician Assistant | Admitting: Physician Assistant

## 2017-06-06 ENCOUNTER — Telehealth: Payer: Self-pay

## 2017-06-06 DIAGNOSIS — Z1231 Encounter for screening mammogram for malignant neoplasm of breast: Secondary | ICD-10-CM | POA: Insufficient documentation

## 2017-06-06 NOTE — Telephone Encounter (Signed)
-----   Message from Margaretann Loveless, [place] sent at 06/06/2017 11:54 AM EDT ----- Normal mammogram. Repeat screening in one year.

## 2017-06-07 NOTE — Telephone Encounter (Signed)
Patient advised as below.  

## 2017-06-07 NOTE — Telephone Encounter (Signed)
-----   Message from Margaretann Loveless, [place] sent at 06/06/2017 11:54 AM EDT ----- Normal mammogram. Repeat screening in one year.

## 2017-10-09 ENCOUNTER — Ambulatory Visit (INDEPENDENT_AMBULATORY_CARE_PROVIDER_SITE_OTHER): Payer: Medicare Other | Admitting: Family Medicine

## 2017-10-09 ENCOUNTER — Encounter: Payer: Self-pay | Admitting: Family Medicine

## 2017-10-09 VITALS — BP 128/76 | HR 84 | Temp 98.5°F | Resp 16 | Wt 154.4 lb

## 2017-10-09 DIAGNOSIS — J301 Allergic rhinitis due to pollen: Secondary | ICD-10-CM | POA: Diagnosis not present

## 2017-10-09 NOTE — Progress Notes (Signed)
  Subjective:     Patient ID: Alison Blackwell, female   DOB: 1940/05/17, 77 y.o.   MRN: [id number] Chief Complaint  Patient presents with  . Sinus Problem    Patient comes into office today with complaints of sinus pain and pressure below her eyes for the past 3 days. Patient reports that she has had a runny nose, fullness in ears and dizziness upon standing. Patient reports that she takes Singulair daily for allergy relief.    c HPI Has started using Claritin and a decongestant nasal spray. States this is her allergy season and Singulair does not help and wishes to quit it. Reports clear sinus congestion but no cough or sore throat.  Review of Systems     Objective:   Physical Exam  Constitutional: She appears well-developed and well-nourished. No distress.  Ears: T.M's intact without inflammation Sinuses: moderate maxillary sinus tenderness Throat: no tonsillar enlargement or exudate Neck: no cervical adenopathy Lungs: clear     Assessment:    1. Seasonal allergic rhinitis due to pollen: resume steroid nasal spray/continue Claritin    Plan:    May stop Singulair. Suggested trial on cromolyn sodium for prevention.

## 2017-10-09 NOTE — Patient Instructions (Signed)
Discussed resuming steroid nasal spay like Flonase. Continue Claritin. Consider Nasalcrom (cromoly sodium) for allergy prevention.

## 2017-10-17 ENCOUNTER — Other Ambulatory Visit: Payer: Self-pay | Admitting: Physician Assistant

## 2017-10-17 DIAGNOSIS — E7800 Pure hypercholesterolemia, unspecified: Secondary | ICD-10-CM

## 2017-10-20 ENCOUNTER — Telehealth: Payer: Self-pay

## 2017-10-20 NOTE — Telephone Encounter (Signed)
Noted  

## 2017-10-20 NOTE — Telephone Encounter (Signed)
Called pt to schedule AWV prior to CPE on 11/27/17 and pt declined and stated she did not need an AWV and that her PCP would do everything her her CPE apt. FYI! -MM

## 2017-10-28 ENCOUNTER — Ambulatory Visit (INDEPENDENT_AMBULATORY_CARE_PROVIDER_SITE_OTHER): Payer: Medicare Other

## 2017-10-28 ENCOUNTER — Encounter

## 2017-10-28 DIAGNOSIS — Z23 Encounter for immunization: Secondary | ICD-10-CM

## 2017-11-08 ENCOUNTER — Ambulatory Visit: Payer: Medicare Other

## 2017-11-13 ENCOUNTER — Other Ambulatory Visit: Payer: Self-pay | Admitting: Physician Assistant

## 2017-11-13 DIAGNOSIS — J309 Allergic rhinitis, unspecified: Secondary | ICD-10-CM

## 2017-11-27 ENCOUNTER — Encounter: Payer: Self-pay | Admitting: Physician Assistant

## 2017-11-27 ENCOUNTER — Ambulatory Visit (INDEPENDENT_AMBULATORY_CARE_PROVIDER_SITE_OTHER): Payer: Medicare Other | Admitting: Physician Assistant

## 2017-11-27 VITALS — BP 146/84 | HR 91 | Temp 98.8°F | Resp 16 | Ht 65.0 in | Wt 153.2 lb

## 2017-11-27 DIAGNOSIS — R7309 Other abnormal glucose: Secondary | ICD-10-CM | POA: Diagnosis not present

## 2017-11-27 DIAGNOSIS — I10 Essential (primary) hypertension: Secondary | ICD-10-CM

## 2017-11-27 DIAGNOSIS — E890 Postprocedural hypothyroidism: Secondary | ICD-10-CM

## 2017-11-27 DIAGNOSIS — Z0000 Encounter for general adult medical examination without abnormal findings: Secondary | ICD-10-CM | POA: Diagnosis not present

## 2017-11-27 DIAGNOSIS — E7800 Pure hypercholesterolemia, unspecified: Secondary | ICD-10-CM

## 2017-11-27 NOTE — Progress Notes (Signed)
Patient: Alison Blackwell, Female    DOB: [birth year], 77 y.o.   MRN: [id number] Visit Date: 11/27/2017  Today's Provider: Margaretann Loveless, PA-C   Chief Complaint  Patient presents with  . Medicare Wellness   Subjective:    Annual wellness visit Alison Blackwell is a 77 y.o. female. She feels well. She reports exercising. She reports she is sleeping well. -----------------------------------------------------------   Review of Systems  Constitutional: Negative.   HENT: Positive for sinus pressure.   Eyes: Negative.   Respiratory: Negative.   Cardiovascular: Negative.   Gastrointestinal: Negative.   Endocrine: Negative.   Genitourinary: Negative.   Musculoskeletal: Negative.   Skin: Negative.   Allergic/Immunologic: Negative.   Neurological: Negative.   Hematological: Negative.   Psychiatric/Behavioral: Negative.     Social History   Socioeconomic History  . Marital status: Divorced    Spouse name: Not on file  . Number of children: 3  . Years of education: College  . Highest education level: Not on file  Occupational History  . Occupation: Retired  Engineer, production  . Financial resource strain: Not on file  . Food insecurity:    Worry: Not on file    Inability: Not on file  . Transportation needs:    Medical: Not on file    Non-medical: Not on file  Tobacco Use  . Smoking status: Former Games developer  . Smokeless tobacco: Never Used  Substance and Sexual Activity  . Alcohol use: Yes    Comment: Occasionally  . Drug use: No  . Sexual activity: Not on file  Lifestyle  . Physical activity:    Days per week: Not on file    Minutes per session: Not on file  . Stress: Not on file  Relationships  . Social connections:    Talks on phone: Not on file    Gets together: Not on file    Attends religious service: Not on file    Active member of club or organization: Not on file    Attends meetings of clubs or organizations: Not on file    Relationship  status: Not on file  . Intimate partner violence:    Fear of current or ex partner: Not on file    Emotionally abused: Not on file    Physically abused: Not on file    Forced sexual activity: Not on file  Other Topics Concern  . Not on file  Social History Narrative  . Not on file    History reviewed. No pertinent past medical history.   Patient Active Problem List   Diagnosis Date Noted  . Cystitis 03/17/2015  . Vertigo 03/17/2015  . Osteopenia 11/24/2014  . Essential (primary) hypertension 11/21/2014  . Blood in the urine 11/21/2014  . Calcium blood increased 11/21/2014  . Awareness of heartbeats 11/21/2014  . Blood pressure elevated without history of HTN 11/21/2014  . Fast heart beat 11/21/2014  . Avitaminosis D 11/21/2014  . Abnormal blood sugar 06/08/2009  . Allergic rhinitis 12/12/2007  . 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 Gastrointestinal ulcer due to Helicobacter pylori 06/07/2002  . Acid reflux 05/17/2002  . Hypercholesteremia 04/08/1998  . Hypothyroidism, postop 08/15/1991    Past Surgical History:  Procedure Laterality Date  . CHOLECYSTECTOMY  2002  . THYROIDECTOMY  1990    Her family history includes Breast cancer in her maternal aunt; Breast cancer (age of onset: 52) in her daughter; Breast cancer (age of onset: 42) in her maternal aunt;  Healthy in her daughter, sister, sister, and son; Lung cancer in her mother.      Current Outpatient Medications:  .  aspirin EC 81 MG tablet, Take 1 tablet by mouth daily., Disp: , Rfl:  .  atorvastatin (LIPITOR) 10 MG tablet, TAKE 1 TABLET BY MOUTH EVERY DAY, Disp: 90 tablet, Rfl: 1 .  cyclobenzaprine (FLEXERIL) 5 MG tablet, Take 0.5-1 tablets (2.5-5 mg total) by mouth at bedtime., Disp: 30 tablet, Rfl: 0 .  montelukast (SINGULAIR) 10 MG tablet, TAKE 1 TABLET (10 MG TOTAL) BY MOUTH DAILY., Disp: 90 tablet, Rfl: 2 .  Multiple Vitamin (MULTI-VITAMINS) TABS, Take 1 tablet by mouth daily., Disp: , Rfl:  .  Omega 3-6-9 CAPS,  Take 1 capsule by mouth daily., Disp: , Rfl:  .  SYNTHROID 50 MCG tablet, ONCE DAILY BY MOUTH DO NOT SUBSTITUTE PLEASE, Disp: , Rfl: 3 .  meloxicam (MOBIC) 7.5 MG tablet, Take 1 tablet (7.5 mg total) by mouth daily. (Patient not taking: Reported on 11/27/2017), Disp: 90 tablet, Rfl: 1 .  oxymetazoline (MUCINEX NASAL SPRAY FULL FORCE) 0.05 % nasal spray, Place 1 spray into both nostrils 2 (two) times daily., Disp: , Rfl:   Patient Care Team: Margaretann Loveless, PA-C as PCP - General (Family Medicine)     Objective:   Vitals: BP (!) 146/84 (BP Location: Left Arm, Patient Position: Sitting, Cuff Size: Large)   Pulse 91   Temp 98.8 F (37.1 C) (Oral)   Resp 16   Ht 5\' 5"  (1.651 m)   Wt 153 lb 3.2 oz (69.5 kg)   BMI 25.49 kg/m   Physical Exam  Constitutional: She is oriented to person, place, and time. She appears well-developed and well-nourished. No distress.  HENT:  Head: Normocephalic and atraumatic.  Right Ear: Hearing, tympanic membrane, external ear and ear canal normal.  Left Ear: Hearing, tympanic membrane, external ear and ear canal normal.  Nose: Nose normal.  Mouth/Throat: Uvula is midline, oropharynx is clear and moist and mucous membranes are normal. No oropharyngeal exudate.  Eyes: Pupils are equal, round, and reactive to light. Conjunctivae and EOM are normal. Right eye exhibits no discharge. Left eye exhibits no discharge. No scleral icterus.  Neck: Normal range of motion. Neck supple. No JVD present. No tracheal deviation present. No thyromegaly present.  Cardiovascular: Normal rate, regular rhythm, normal heart sounds and intact distal pulses. Exam reveals no gallop and no friction rub.  No murmur heard. Pulmonary/Chest: Effort normal and breath sounds normal. No respiratory distress. She has no wheezes. She has no rales. She exhibits no tenderness.  Abdominal: Soft. Bowel sounds are normal. She exhibits no distension and no mass. There is no tenderness. There is no  rebound and no guarding.  Musculoskeletal: Normal range of motion. She exhibits no edema or tenderness.  Lymphadenopathy:    She has no cervical adenopathy.  Neurological: She is alert and oriented to person, place, and time.  Skin: Skin is warm and dry. No rash noted. She is not diaphoretic.  Psychiatric: She has a normal mood and affect. Her behavior is normal. Judgment and thought content normal.  Vitals reviewed.   Activities of Daily Living In your present state of health, do you have any difficulty performing the following activities: 11/27/2017  Hearing? N  Vision? N  Difficulty concentrating or making decisions? N  Walking or climbing stairs? N  Dressing or bathing? N  Doing errands, shopping? N  Some recent data might be hidden    Fall  Risk Assessment Fall Risk  11/27/2017 11/25/2016 11/25/2015 11/24/2014  Falls in the past year? 0 No No No     Depression Screen PHQ 2/9 Scores 11/27/2017 11/25/2016 11/25/2015 11/24/2014  PHQ - 2 Score 0 0 0 0  PHQ- 9 Score - 0 - -    Cognitive Testing - 6-CIT  Correct? Score   What year is it? yes 0 0 or 4  What month is it? yes 0 0 or 3  Memorize:    Floyde Parkins,  43,  [address]  [place],      What time is it? (within 1 hour) yes 0 0 or 3  Count backwards from 20 yes 0 0, 2, or 4  Name the months of the year yes 0 0, 2, or 4  Repeat name & address above yes 0 0, 2, 4, 6, 8, or 10       TOTAL SCORE  0/28   Interpretation:  Normal  Normal (0-7) Abnormal (8-28)     Assessment & Plan:     Annual Wellness Visit  Reviewed patient's Family Medical History Reviewed and updated list of patient's medical providers Assessment of cognitive impairment was done Assessed patient's functional ability Established a written schedule for health screening services Health Risk Assessent Completed and Reviewed  Exercise Activities and Dietary recommendations Goals   None     Immunization History  Administered Date(s) Administered  .  Influenza, High Dose Seasonal PF 11/08/2014, 11/06/2015, 11/09/2016, 10/28/2017  . Pneumococcal Conjugate-13 11/02/2013  . Pneumococcal Polysaccharide-23 11/17/2007  . Td 05/14/2008  . Zoster 11/22/2007    Health Maintenance  Topic Date Due  . Janet Berlin  05/15/2018  . INFLUENZA VACCINE  Completed  . DEXA SCAN  Completed  . PNA vac Low Risk Adult  Completed     Discussed health benefits of physical activity, and encouraged her to engage in regular exercise appropriate for her age and condition.    1. Medicare annual wellness visit, subsequent Normal exam. Up to date on screenings and vaccinations.   2. Hypercholesteremia Stable. Continue Atorvastatin 10mg . Will check labs as below and f/u pending results. - Lipid panel  3. Hypothyroidism, postop Stable. Continue Synthroid . Will check labs as below and f/u pending results. - TSH  4. Abnormal blood sugar H/O slight elevation. Will check labs as below and f/u pending results. - Hemoglobin A1c  5. Essential (primary) hypertension Slightly elevated today. Will monitor. Will check labs as below and f/u pending results. - CBC with Differential/Platelet - Comprehensive metabolic panel  ------------------------------------------------------------------------------------------------------------    Margaretann Loveless, PA-C  [facility]

## 2017-11-27 NOTE — Patient Instructions (Signed)
Flonase Sensimist for allergies and sinus pressure  Call insurance company about Tetanus vaccine (due 04/2018) and Shingrix (new shingles vaccine).

## 2017-11-28 ENCOUNTER — Telehealth: Payer: Self-pay

## 2017-11-28 LAB — LIPID PANEL
Chol/HDL Ratio: 3.1 ratio (ref 0.0–4.4)
Cholesterol, Total: 156 mg/dL (ref 100–199)
HDL: 50 mg/dL (ref >39)
LDL Calculated: 80 mg/dL (ref 0–99)
Triglycerides: 130 mg/dL (ref 0–149)
VLDL CHOLESTEROL CAL: 26 mg/dL (ref 5–40)

## 2017-11-28 LAB — COMPREHENSIVE METABOLIC PANEL
A/G RATIO: 1.9 (ref 1.2–2.2)
ALT: 21 IU/L (ref 0–32)
AST: 23 IU/L (ref 0–40)
Albumin: 4.5 g/dL (ref 3.5–4.8)
Alkaline Phosphatase: 79 IU/L (ref 39–117)
BILIRUBIN TOTAL: 1 mg/dL (ref 0.0–1.2)
BUN/Creatinine Ratio: 13 (ref 12–28)
BUN: 12 mg/dL (ref 8–27)
CO2: 25 mmol/L (ref 20–29)
Calcium: 10.3 mg/dL (ref 8.7–10.3)
Chloride: 102 mmol/L (ref 96–106)
Creatinine, Ser: 0.92 mg/dL (ref 0.57–1.00)
GFR calc Af Amer: 70 mL/min/{1.73_m2} (ref >59)
GFR, EST NON AFRICAN AMERICAN: 61 mL/min/{1.73_m2} (ref >59)
GLOBULIN, TOTAL: 2.4 g/dL (ref 1.5–4.5)
Glucose: 104 mg/dL — ABNORMAL HIGH (ref 65–99)
POTASSIUM: 4.2 mmol/L (ref 3.5–5.2)
Sodium: 141 mmol/L (ref 134–144)
Total Protein: 6.9 g/dL (ref 6.0–8.5)

## 2017-11-28 LAB — CBC WITH DIFFERENTIAL/PLATELET
BASOS ABS: 0.1 10*3/uL (ref 0.0–0.2)
Basos: 1 %
EOS (ABSOLUTE): 0.2 10*3/uL (ref 0.0–0.4)
Eos: 2 %
HEMOGLOBIN: 14 g/dL (ref 11.1–15.9)
Hematocrit: 41 % (ref 34.0–46.6)
Immature Grans (Abs): 0 10*3/uL (ref 0.0–0.1)
Immature Granulocytes: 0 %
LYMPHS ABS: 1.2 10*3/uL (ref 0.7–3.1)
Lymphs: 18 %
MCH: 30.7 pg (ref 26.6–33.0)
MCHC: 34.1 g/dL (ref 31.5–35.7)
MCV: 90 fL (ref 79–97)
MONOCYTES: 6 %
Monocytes Absolute: 0.4 10*3/uL (ref 0.1–0.9)
NEUTROS ABS: 4.8 10*3/uL (ref 1.4–7.0)
Neutrophils: 73 %
PLATELETS: 233 10*3/uL (ref 150–450)
RBC: 4.56 x10E6/uL (ref 3.77–5.28)
RDW: 12.7 % (ref 12.3–15.4)
WBC: 6.6 10*3/uL (ref 3.4–10.8)

## 2017-11-28 LAB — TSH: TSH: 0.045 u[IU]/mL — AB (ref 0.450–4.500)

## 2017-11-28 LAB — HEMOGLOBIN A1C
ESTIMATED AVERAGE GLUCOSE: 123 mg/dL
HEMOGLOBIN A1C: 5.9 % — AB (ref 4.8–5.6)

## 2017-11-28 NOTE — Telephone Encounter (Signed)
Patient was advised and states she would like for you to contact the doctor Sharin Grave [facility]) that prescribed the medication @ [phone].

## 2017-11-28 NOTE — Telephone Encounter (Signed)
-----   Message from Margaretann Loveless, [place] sent at 11/28/2017  2:42 PM EST ----- Thyroid level overcorrected meaning your Synthroid dose may be too high. Would recommend decreasing dose. Sometimes as you age the same dose is not required due to how medication absorption can slow down. If agreeable I will change dose. All other labs are normal and stable.

## 2017-11-29 NOTE — Telephone Encounter (Signed)
I will forward results to him

## 2018-04-19 ENCOUNTER — Other Ambulatory Visit: Payer: Self-pay | Admitting: Physician Assistant

## 2018-04-19 DIAGNOSIS — E7800 Pure hypercholesterolemia, unspecified: Secondary | ICD-10-CM

## 2018-05-09 ENCOUNTER — Other Ambulatory Visit: Payer: Self-pay | Admitting: Physician Assistant

## 2018-05-09 DIAGNOSIS — Z1231 Encounter for screening mammogram for malignant neoplasm of breast: Secondary | ICD-10-CM

## 2018-06-29 ENCOUNTER — Ambulatory Visit
Admission: RE | Admit: 2018-06-29 | Discharge: 2018-06-29 | Disposition: A | Discharge status: Home / Self Care | Payer: Medicare Other | Source: Ambulatory Visit | Attending: Physician Assistant | Admitting: Physician Assistant

## 2018-06-29 ENCOUNTER — Other Ambulatory Visit: Payer: Self-pay

## 2018-06-29 DIAGNOSIS — Z1231 Encounter for screening mammogram for malignant neoplasm of breast: Secondary | ICD-10-CM

## 2018-10-23 ENCOUNTER — Ambulatory Visit: Payer: Self-pay

## 2018-11-14 ENCOUNTER — Other Ambulatory Visit: Payer: Self-pay | Admitting: Physician Assistant

## 2018-11-14 DIAGNOSIS — E7800 Pure hypercholesterolemia, unspecified: Secondary | ICD-10-CM

## 2018-11-30 ENCOUNTER — Encounter: Payer: Medicare Other | Admitting: Physician Assistant

## 2018-12-05 NOTE — Progress Notes (Signed)
Patient: Alison Blackwell, Female    DOB: July 11, 1940, 78 y.o.   MRN: [id number] Visit Date: 12/07/2018  Today's Provider: Margaretann Loveless, PA-C   Chief Complaint  Patient presents with   Medicare Wellness   Subjective:     Annual wellness visit Alison Blackwell is a 78 y.o. female. She feels well. She reports exercising none. She reports she is sleeping well. ----------------------------------------------------- 06/29/2018 -Normal mammogram. Repeat screening in one year. 02/13/14-Colonoscopy-diverticulosis  Review of Systems  Constitutional: Negative.   HENT: Positive for sinus pressure.   Eyes: Negative.   Respiratory: Negative.   Cardiovascular: Negative.   Gastrointestinal: Negative.   Endocrine: Negative.   Genitourinary: Negative.   Musculoskeletal: Negative.   Skin: Negative.   Allergic/Immunologic: Positive for environmental allergies.  Neurological: Negative.   Hematological: Negative.   Psychiatric/Behavioral: Negative.     Social History   Socioeconomic History   Marital status: Divorced    Spouse name: Not on file   Number of children: 3   Years of education: College   Highest education level: Not on file  Occupational History   Occupation: Retired  Ecologist strain: Not on file   Food insecurity    Worry: Not on file    Inability: Not on Occupational hygienist needs    Medical: Not on file    Non-medical: Not on file  Tobacco Use   Smoking status: Former Smoker   Smokeless tobacco: Never Used  Substance and Sexual Activity   Alcohol use: Yes    Comment: Occasionally   Drug use: No   Sexual activity: Not on file  Lifestyle   Physical activity    Days per week: Not on file    Minutes per session: Not on file   Stress: Not on file  Relationships   Social connections    Talks on phone: Not on file    Gets together: Not on file    Attends religious service: Not on file    Active  member of club or organization: Not on file    Attends meetings of clubs or organizations: Not on file    Relationship status: Not on file   Intimate partner violence    Fear of current or ex partner: Not on file    Emotionally abused: Not on file    Physically abused: Not on file    Forced sexual activity: Not on file  Other Topics Concern   Not on file  Social History Narrative   Not on file    History reviewed. No pertinent past medical history.   Patient Active Problem List   Diagnosis Date Noted   Cystitis 03/17/2015   Vertigo 03/17/2015   Osteopenia 11/24/2014   Essential (primary) hypertension 11/21/2014   Blood in the urine 11/21/2014   Calcium blood increased 11/21/2014   Awareness of heartbeats 11/21/2014   Blood pressure elevated without history of HTN 11/21/2014   Fast heart beat 11/21/2014   Avitaminosis D 11/21/2014   Abnormal blood sugar 06/08/2009   Allergic rhinitis 12/12/2007   Adaptive colitis 07/11/2007   Gastrointestinal ulcer due to Helicobacter pylori 06/07/2002   Acid reflux 05/17/2002   Hypercholesteremia 04/08/1998   Hypothyroidism, 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   Current Outpatient Medications:    aspirin EC 81 MG tablet, Take 1 tablet by mouth daily., Disp: , Rfl:    atorvastatin (LIPITOR) 10 MG tablet, TAKE 1 TABLET BY MOUTH EVERY DAY, Disp: 90 tablet, Rfl: 1   cyclobenzaprine (FLEXERIL) 5 MG tablet, Take 0.5-1 tablets (2.5-5 mg total) by mouth at bedtime., Disp: 30 tablet, Rfl: 0   Multiple Vitamin (MULTI-VITAMINS) TABS, Take 1 tablet by mouth daily., Disp: , Rfl:    Omega 3-6-9  CAPS, Take 1 capsule by mouth daily., Disp: , Rfl:    SYNTHROID 50 MCG tablet, ONCE DAILY BY MOUTH DO NOT SUBSTITUTE PLEASE, Disp: , Rfl: 3  Patient Care Team: Margaretann LovelessBurnette, Aedon Deason M, PA-C as PCP - General (Family Medicine)    Objective:    Vitals: BP (!) 173/79 (BP Location: Left Arm, Patient Position: Sitting, Cuff Size: Large)    Pulse (!) 106    Temp (!) 97.2 F (36.2 C) (Temporal)    Resp 16    Ht 5\' 5"  (1.651 m)    Wt 149 lb (67.6 kg)    BMI 24.79 kg/m   Physical Exam Vitals signs reviewed.  Constitutional:      General: She is not in acute distress.    Appearance: Normal appearance. She is well-developed and normal weight. She is not ill-appearing or diaphoretic.  HENT:     Head: Normocephalic and atraumatic.     Right Ear: Tympanic membrane, ear canal and external ear normal.     Left Ear: Tympanic membrane, ear canal and external ear normal.     Nose: Nose normal.     Mouth/Throat:     Mouth: Mucous membranes are moist.     Pharynx: Oropharynx is clear. No oropharyngeal exudate.  Eyes:     General: No scleral icterus.       Right eye: No discharge.        Left eye: No discharge.     Extraocular Movements: Extraocular movements intact.     Conjunctiva/sclera: Conjunctivae normal.     Pupils: Pupils are equal, round, and reactive to light.  Neck:     Musculoskeletal: Normal range of motion and neck supple.     Thyroid: No thyromegaly.     Vascular: No JVD.     Trachea: No tracheal deviation.  Cardiovascular:     Rate and Rhythm: Normal rate and regular rhythm.     Pulses: Normal pulses.     Heart sounds: Normal heart sounds. No murmur. No friction rub. No gallop.   Pulmonary:     Effort: Pulmonary effort is normal. No respiratory distress.     Breath sounds: Normal breath sounds. No wheezing or rales.  Chest:     Chest wall: No tenderness.  Abdominal:     General: Abdomen is flat. Bowel sounds are normal. There is no distension.     Palpations: Abdomen is soft.  There is no mass.     Tenderness: There is no abdominal tenderness. There is no guarding or rebound.  Musculoskeletal: Normal range of motion.        General: No tenderness.     Right lower leg: No edema.     Left lower leg: No edema.  Lymphadenopathy:     Cervical: No cervical adenopathy.  Skin:    General: Skin is warm and dry.  Capillary Refill: Capillary refill takes less than 2 seconds.     Findings: No rash.  Neurological:     General: No focal deficit present.     Mental Status: She is alert and oriented to person, place, and time. Mental status is at baseline.  Psychiatric:        Mood and Affect: Mood normal.        Behavior: Behavior normal.        Thought Content: Thought content normal.        Judgment: Judgment normal.     Activities of Daily Living In your present state of health, do you have any difficulty performing the following activities: 12/07/2018  Hearing? N  Vision? N  Difficulty concentrating or making decisions? Y  Comment "remembering" sometimes  Walking or climbing stairs? N  Dressing or bathing? N  Doing errands, shopping? N  Some recent data might be hidden    Fall Risk Assessment Fall Risk  12/07/2018 11/27/2017 11/25/2016 11/25/2015 11/24/2014  Falls in the past year? 1 0 No No No  Number falls in past yr: 0 - - - -  Injury with Fall? 1 - - - -  Comment Tripped and hurt her mouth and was seen by the dentist. - - - -  Follow up Falls evaluation completed - - - -     Depression Screen PHQ 2/9 Scores 12/07/2018 11/27/2017 11/25/2016 11/25/2015  PHQ - 2 Score 0 0 0 0  PHQ- 9 Score - - 0 -    6CIT Screen 12/07/2018  What Year? 0 points  What month? 0 points  What time? 0 points  Count back from 20 0 points  Months in reverse 0 points  Repeat phrase 2 points  Total Score 2      Assessment & Plan:     Annual Wellness Visit  Reviewed patient's Family Medical History Reviewed and updated list of patient's medical  providers Assessment of cognitive impairment was done Assessed patient's functional ability Established a written schedule for health screening [employer] Completed and Reviewed  Exercise Activities and Dietary recommendations Goals   None     Immunization History  Administered Date(s) Administered   Influenza, High Dose Seasonal PF 11/08/2014, 11/06/2015, 11/09/2016, 10/28/2017   Influenza-Unspecified 10/22/2018   Pneumococcal Conjugate-13 11/02/2013   Pneumococcal Polysaccharide-23 11/17/2007   Td 03/19/1996, 05/14/2008   Zoster 11/22/2007   Zoster Recombinat (Shingrix) 01/23/2018, 07/02/2018    Health Maintenance  Topic Date Due   TETANUS/TDAP  05/15/2018   INFLUENZA VACCINE  Completed   DEXA SCAN  Completed   PNA vac Low Risk Adult  Completed     Discussed health benefits of physical activity, and encouraged her to engage in regular exercise appropriate for her age and condition.    1. Medicare annual wellness visit, subsequent Normal exam today. No complaints. Up to date on screenings and vaccinations.  - CBC w/Diff - Comprehensive Metabolic Panel (CMET)  2. Hypercholesteremia Continue Atorvastatin 10mg . Will check labs as below and f/u pending results. - CBC w/Diff - Comprehensive Metabolic Panel (CMET) - HgB A1c - Lipid Profile  3. Hypothyroidism, postop Stable. Continue Synthroid 73mcg. Will check labs as below and f/u pending results. - CBC w/Diff - Comprehensive Metabolic Panel (CMET) - TSH  4. Essential (primary) hypertension Elevated today. Will monitor. Limit salt in diet. Will check labs as below and f/u pending results. - CBC w/Diff - Comprehensive Metabolic Panel (CMET) - HgB A1c - Lipid Profile  ------------------------------------------------------------------------------------------------------------  Margaretann Loveless, PA-C  [facility]

## 2018-12-07 ENCOUNTER — Ambulatory Visit (INDEPENDENT_AMBULATORY_CARE_PROVIDER_SITE_OTHER): Payer: Medicare Other | Admitting: Physician Assistant

## 2018-12-07 ENCOUNTER — Other Ambulatory Visit: Payer: Self-pay

## 2018-12-07 ENCOUNTER — Encounter: Payer: Self-pay | Admitting: Physician Assistant

## 2018-12-07 VITALS — BP 173/79 | HR 106 | Temp 97.2°F | Resp 16 | Ht 65.0 in | Wt 149.0 lb

## 2018-12-07 DIAGNOSIS — I10 Essential (primary) hypertension: Secondary | ICD-10-CM | POA: Diagnosis not present

## 2018-12-07 DIAGNOSIS — E890 Postprocedural hypothyroidism: Secondary | ICD-10-CM | POA: Diagnosis not present

## 2018-12-07 DIAGNOSIS — E7800 Pure hypercholesterolemia, unspecified: Secondary | ICD-10-CM | POA: Diagnosis not present

## 2018-12-07 DIAGNOSIS — Z0000 Encounter for general adult medical examination without abnormal findings: Secondary | ICD-10-CM

## 2018-12-07 NOTE — Patient Instructions (Signed)
Health Maintenance After Age 77 After age 77, you are at a higher risk for certain long-term diseases and infections as well as injuries from falls. Falls are a major cause of broken bones and head injuries in people who are older than age 77. Getting regular preventive care can help to keep you healthy and well. Preventive care includes getting regular testing and making lifestyle changes as recommended by your health care provider. Talk with your health care provider about:  Which screenings and tests you should have. A screening is a test that checks for a disease when you have no symptoms.  A diet and exercise plan that is right for you. What should I know about screenings and tests to prevent falls? Screening and testing are the best ways to find a health problem early. Early diagnosis and treatment give you the best chance of managing medical conditions that are common after age 77. Certain conditions and lifestyle choices may make you more likely to have a fall. Your health care provider may recommend:  Regular vision checks. Poor vision and conditions such as cataracts can make you more likely to have a fall. If you wear glasses, make sure to get your prescription updated if your vision changes.  Medicine review. Work with your health care provider to regularly review all of the medicines you are taking, including over-the-counter medicines. Ask your health care provider about any side effects that may make you more likely to have a fall. Tell your health care provider if any medicines that you take make you feel dizzy or sleepy.  Osteoporosis screening. Osteoporosis is a condition that causes the bones to get weaker. This can make the bones weak and cause them to break more easily.  Blood pressure screening. Blood pressure changes and medicines to control blood pressure can make you feel dizzy.  Strength and balance checks. Your health care provider may recommend certain tests to check your  strength and balance while standing, walking, or changing positions.  Foot health exam. Foot pain and numbness, as well as not wearing proper footwear, can make you more likely to have a fall.  Depression screening. You may be more likely to have a fall if you have a fear of falling, feel emotionally low, or feel unable to do activities that you used to do.  Alcohol use screening. Using too much alcohol can affect your balance and may make you more likely to have a fall. What actions can I take to lower my risk of falls? General instructions  Talk with your health care provider about your risks for falling. Tell your health care provider if: ? You fall. Be sure to tell your health care provider about all falls, even ones that seem minor. ? You feel dizzy, sleepy, or off-balance.  Take over-the-counter and prescription medicines only as told by your health care provider. These include any supplements.  Eat a healthy diet and maintain a healthy weight. A healthy diet includes low-fat dairy products, low-fat (lean) meats, and fiber from whole grains, beans, and lots of fruits and vegetables. Home safety  Remove any tripping hazards, such as rugs, cords, and clutter.  Install safety equipment such as grab bars in bathrooms and safety rails on stairs.  Keep rooms and walkways well-lit. Activity   Follow a regular exercise program to stay fit. This will help you maintain your balance. Ask your health care provider what types of exercise are appropriate for you.  If you need a cane or   walker, use it as recommended by your health care provider.  Wear supportive shoes that have nonskid soles. Lifestyle  Do not drink alcohol if your health care provider tells you not to drink.  If you drink alcohol, limit how much you have: ? 0-1 drink a day for women. ? 0-2 drinks a day for men.  Be aware of how much alcohol is in your drink. In the [place] one drink equals one typical bottle of beer (12  oz), one-half glass of wine (5 oz), or one shot of hard liquor (1 oz).  Do not use any products that contain nicotine or tobacco, such as cigarettes and e-cigarettes. If you need help quitting, ask your health care provider. Summary  Having a healthy lifestyle and getting preventive care can help to protect your health and wellness after age 77.  Screening and testing are the best way to find a health problem early and help you avoid having a fall. Early diagnosis and treatment give you the best chance for managing medical conditions that are more common for people who are older than age 77.  Falls are a major cause of broken bones and head injuries in people who are older than age 77. Take precautions to prevent a fall at home.  Work with your health care provider to learn what changes you can make to improve your health and wellness and to prevent falls. This information is not intended to replace advice given to you by your health care provider. Make sure you discuss any questions you have with your health care provider. Document Released: 11/23/2016 Document Revised: 05/03/2018 Document Reviewed: 11/23/2016 Elsevier Patient Education  2020 [employer]  

## 2018-12-08 LAB — COMPREHENSIVE METABOLIC PANEL
ALT: 18 IU/L (ref 0–32)
AST: 20 IU/L (ref 0–40)
Albumin/Globulin Ratio: 1.9 (ref 1.2–2.2)
Albumin: 4.5 g/dL (ref 3.7–4.7)
Alkaline Phosphatase: 88 IU/L (ref 39–117)
BUN/Creatinine Ratio: 12 (ref 12–28)
BUN: 11 mg/dL (ref 8–27)
Bilirubin Total: 1 mg/dL (ref 0.0–1.2)
CO2: 26 mmol/L (ref 20–29)
Calcium: 10.4 mg/dL — ABNORMAL HIGH (ref 8.7–10.3)
Chloride: 103 mmol/L (ref 96–106)
Creatinine, Ser: 0.93 mg/dL (ref 0.57–1.00)
GFR calc Af Amer: 69 mL/min/{1.73_m2} (ref >59)
GFR calc non Af Amer: 59 mL/min/{1.73_m2} — ABNORMAL LOW (ref >59)
Globulin, Total: 2.4 g/dL (ref 1.5–4.5)
Glucose: 115 mg/dL — ABNORMAL HIGH (ref 65–99)
Potassium: 4.2 mmol/L (ref 3.5–5.2)
Sodium: 142 mmol/L (ref 134–144)
Total Protein: 6.9 g/dL (ref 6.0–8.5)

## 2018-12-08 LAB — CBC WITH DIFFERENTIAL/PLATELET
Basophils Absolute: 0.1 10*3/uL (ref 0.0–0.2)
Basos: 1 %
EOS (ABSOLUTE): 0.1 10*3/uL (ref 0.0–0.4)
Eos: 1 %
Hematocrit: 42.7 % (ref 34.0–46.6)
Hemoglobin: 14.3 g/dL (ref 11.1–15.9)
Immature Grans (Abs): 0 10*3/uL (ref 0.0–0.1)
Immature Granulocytes: 0 %
Lymphocytes Absolute: 1 10*3/uL (ref 0.7–3.1)
Lymphs: 11 %
MCH: 30.2 pg (ref 26.6–33.0)
MCHC: 33.5 g/dL (ref 31.5–35.7)
MCV: 90 fL (ref 79–97)
Monocytes Absolute: 0.5 10*3/uL (ref 0.1–0.9)
Monocytes: 6 %
Neutrophils Absolute: 6.8 10*3/uL (ref 1.4–7.0)
Neutrophils: 81 %
Platelets: 222 10*3/uL (ref 150–450)
RBC: 4.73 x10E6/uL (ref 3.77–5.28)
RDW: 12.4 % (ref 11.7–15.4)
WBC: 8.4 10*3/uL (ref 3.4–10.8)

## 2018-12-08 LAB — LIPID PANEL
Chol/HDL Ratio: 2.5 ratio (ref 0.0–4.4)
Cholesterol, Total: 149 mg/dL (ref 100–199)
HDL: 60 mg/dL (ref >39)
LDL Chol Calc (NIH): 71 mg/dL (ref 0–99)
Triglycerides: 101 mg/dL (ref 0–149)
VLDL Cholesterol Cal: 18 mg/dL (ref 5–40)

## 2018-12-08 LAB — TSH: TSH: 0.015 u[IU]/mL — ABNORMAL LOW (ref 0.450–4.500)

## 2018-12-08 LAB — HEMOGLOBIN A1C
Est. average glucose Bld gHb Est-mCnc: 123 mg/dL
Hgb A1c MFr Bld: 5.9 % — ABNORMAL HIGH (ref 4.8–5.6)

## 2018-12-10 ENCOUNTER — Telehealth: Payer: Self-pay

## 2018-12-10 NOTE — Telephone Encounter (Signed)
Pt sees Dr. Ronnald Collum for her thyroid.  She is going to call him with the results and see if he wants to reduce her Synthroid.   Thanks,   -Mickel Baas

## 2018-12-10 NOTE — Telephone Encounter (Signed)
-----   Message from Mar Daring, [place] sent at 12/10/2018  9:06 AM EST ----- Blood count is normal. Kidney and liver function are normal. Sodium, potassium and calcium are normal. A1c is stable at 5.9. Cholesterol is great. Thyroid is still overcorrected. I would recommend for [place] to decrease your Synthroid to 71mcg.

## 2019-05-09 ENCOUNTER — Other Ambulatory Visit: Payer: Self-pay | Admitting: Physician Assistant

## 2019-05-09 DIAGNOSIS — E7800 Pure hypercholesterolemia, unspecified: Secondary | ICD-10-CM

## 2019-05-22 ENCOUNTER — Other Ambulatory Visit: Payer: Self-pay | Admitting: Physician Assistant

## 2019-05-22 DIAGNOSIS — Z1231 Encounter for screening mammogram for malignant neoplasm of breast: Secondary | ICD-10-CM

## 2019-07-02 ENCOUNTER — Ambulatory Visit
Admission: RE | Admit: 2019-07-02 | Discharge: 2019-07-02 | Disposition: A | Discharge status: Home / Self Care | Payer: Medicare PPO | Source: Ambulatory Visit | Attending: Physician Assistant | Admitting: Physician Assistant

## 2019-07-02 DIAGNOSIS — Z1231 Encounter for screening mammogram for malignant neoplasm of breast: Secondary | ICD-10-CM

## 2019-07-03 ENCOUNTER — Other Ambulatory Visit: Payer: Self-pay | Admitting: Physician Assistant

## 2019-07-03 DIAGNOSIS — N6320 Unspecified lump in the left breast, unspecified quadrant: Secondary | ICD-10-CM

## 2019-07-03 DIAGNOSIS — R921 Mammographic calcification found on diagnostic imaging of breast: Secondary | ICD-10-CM

## 2019-07-03 DIAGNOSIS — R928 Other abnormal and inconclusive findings on diagnostic imaging of breast: Secondary | ICD-10-CM

## 2019-07-24 ENCOUNTER — Ambulatory Visit
Admission: RE | Admit: 2019-07-24 | Discharge: 2019-07-24 | Disposition: A | Discharge status: Home / Self Care | Payer: Medicare PPO | Source: Ambulatory Visit | Attending: Physician Assistant | Admitting: Physician Assistant

## 2019-07-24 DIAGNOSIS — R921 Mammographic calcification found on diagnostic imaging of breast: Secondary | ICD-10-CM | POA: Diagnosis present

## 2019-07-24 DIAGNOSIS — R928 Other abnormal and inconclusive findings on diagnostic imaging of breast: Secondary | ICD-10-CM | POA: Insufficient documentation

## 2019-07-24 DIAGNOSIS — N6320 Unspecified lump in the left breast, unspecified quadrant: Secondary | ICD-10-CM | POA: Diagnosis present

## 2019-11-07 ENCOUNTER — Other Ambulatory Visit: Payer: Self-pay | Admitting: Physician Assistant

## 2019-11-07 DIAGNOSIS — E7800 Pure hypercholesterolemia, unspecified: Secondary | ICD-10-CM

## 2019-12-11 ENCOUNTER — Encounter: Payer: Self-pay | Admitting: Physician Assistant

## 2019-12-12 ENCOUNTER — Encounter: Payer: Self-pay | Admitting: Physician Assistant

## 2019-12-24 NOTE — Progress Notes (Signed)
Annual Wellness Visit     Patient: Alison Blackwell, Female    DOB: 09/15/1940, 79 y.o.   MRN: [id number] Visit Date: 12/25/2019  Today's Provider: Margaretann Loveless, PA-C   Chief Complaint  Patient presents with  . Medicare Wellness   Subjective    Alison Blackwell is a 79 y.o. female who presents today for her Annual Wellness Visit. She reports consuming a general diet. Home exercise routine includes stretching, treadmill and walk 2-3 times a week. She generally feels well. She reports sleeping fairly well. She does not have additional problems to discuss today.   HPI Mammogram done 07/02/2019 Colonoscopy done 02/13/14-Diverticulosis repeat in 5 years.  Medications: Outpatient Medications Prior to Visit  Medication Sig  . aspirin EC 81 MG tablet Take 1 tablet by mouth daily.  Marland Kitchen atorvastatin (LIPITOR) 10 MG tablet TAKE 1 TABLET BY MOUTH EVERY DAY  . Multiple Vitamin (MULTI-VITAMINS) TABS Take 1 tablet by mouth daily.  [name] 3-6-9 CAPS Take 1 capsule by mouth daily.  Marland Kitchen SYNTHROID 50 MCG tablet ONCE DAILY BY MOUTH DO NOT SUBSTITUTE PLEASE  . cyclobenzaprine (FLEXERIL) 5 MG tablet Take 0.5-1 tablets (2.5-5 mg total) by mouth at bedtime. (Patient not taking: Reported on 12/25/2019)   No facility-administered medications prior to visit.    No Known Allergies  Patient Care Team: Reine Just as PCP - General (Family Medicine)  Review of Systems  Constitutional: Negative.   HENT: Positive for sinus pressure.   Eyes: Negative.   Respiratory: Negative.   Cardiovascular: Negative.   Gastrointestinal: Negative.   Endocrine: Negative.   Genitourinary: Negative.   Musculoskeletal: Positive for arthralgias.  Skin: Negative.   Allergic/Immunologic: Positive for environmental allergies.  Neurological: Negative.   Hematological: Negative.   Psychiatric/Behavioral: Negative.        Objective    Vitals: BP (!) 173/90 (BP Location: Left Arm, Patient  Position: Sitting, Cuff Size: Normal) Comment: reports that at home she gets 139/80  Pulse 88   Temp 98.5 F (36.9 C) (Oral)   Resp 16   Ht 5' 5.5" (1.664 m)   Wt 150 lb 8 oz (68.3 kg)   BMI 24.66 kg/m     Physical Exam Vitals reviewed.  Constitutional:      General: She is not in acute distress.    Appearance: Normal appearance. She is well-developed and normal weight. She is not ill-appearing or diaphoretic.  HENT:     Head: Normocephalic and atraumatic.     Right Ear: Tympanic membrane, ear canal and external ear normal.     Left Ear: Tympanic membrane, ear canal and external ear normal.     Nose: Nose normal.     Mouth/Throat:     Mouth: Mucous membranes are moist.     Pharynx: Oropharynx is clear. No oropharyngeal exudate or posterior oropharyngeal erythema.  Eyes:     General: No scleral icterus.       Right eye: No discharge.        Left eye: No discharge.     Extraocular Movements: Extraocular movements intact.     Conjunctiva/sclera: Conjunctivae normal.     Pupils: Pupils are equal, round, and reactive to light.  Neck:     Thyroid: No thyromegaly.     Vascular: No carotid bruit or JVD.     Trachea: No tracheal deviation.  Cardiovascular:     Rate and Rhythm: Normal rate and regular rhythm.     Pulses: Normal  pulses.     Heart sounds: Normal heart sounds. No murmur heard.  No friction rub. No gallop.   Pulmonary:     Effort: Pulmonary effort is normal. No respiratory distress.     Breath sounds: Normal breath sounds. No wheezing or rales.  Chest:     Chest wall: No tenderness.  Abdominal:     General: Abdomen is flat. Bowel sounds are normal. There is no distension.     Palpations: Abdomen is soft. There is no mass.     Tenderness: There is no abdominal tenderness. There is no guarding or rebound.  Musculoskeletal:        General: No tenderness. Normal range of motion.     Cervical back: Normal range of motion and neck supple. No tenderness.    Lymphadenopathy:     Cervical: No cervical adenopathy.  Skin:    General: Skin is warm and dry.     Capillary Refill: Capillary refill takes less than 2 seconds.     Findings: No rash.  Neurological:     General: No focal deficit present.     Mental Status: She is alert and oriented to person, place, and time. Mental status is at baseline.  Psychiatric:        Mood and Affect: Mood normal.        Behavior: Behavior normal.        Thought Content: Thought content normal.        Judgment: Judgment normal.     Most recent functional status assessment: In your present state of health, do you have any difficulty performing the following activities: 12/25/2019  Hearing? N  Vision? N  Difficulty concentrating or making decisions? N  Walking or climbing stairs? N  Dressing or bathing? N  Doing errands, shopping? N  Some recent data might be hidden   Most recent fall risk assessment: Fall Risk  12/25/2019  Falls in the past year? 0  Number falls in past yr: 0  Injury with Fall? 0  Comment -  Follow up Falls evaluation completed    Most recent depression screenings: PHQ 2/9 Scores 12/25/2019 12/07/2018  PHQ - 2 Score 0 0  PHQ- 9 Score - -   Most recent cognitive screening: 6CIT Screen 12/25/2019  What Year? 0 points  What month? 0 points  What time? 0 points  Count back from 20 0 points  Months in reverse 0 points  Repeat phrase 2 points  Total Score 2   Most recent Audit-C alcohol use screening Alcohol Use Disorder Test (AUDIT) 12/25/2019  1. How often do you have a drink containing alcohol? 1  2. How many drinks containing alcohol do you have on a typical day when you are drinking? 0  3. How often do you have six or more drinks on one occasion? 0  AUDIT-C Score 1  Alcohol Brief Interventions/Follow-up AUDIT Score <7 follow-up not indicated   A score of 3 or more in women, and 4 or more in men indicates increased risk for alcohol abuse, EXCEPT if all of the points are  from question 1   No results found for any visits on 12/25/19.  Assessment & Plan     Annual wellness visit done today including the all of the following: Reviewed patient's Family Medical History Reviewed and updated list of patient's medical providers Assessment of cognitive impairment was done Assessed patient's functional ability Established a written schedule for health screening services Health Risk Assessent Completed and Reviewed  Exercise Activities and Dietary recommendations Goals   None     Immunization History  Administered Date(s) Administered  . Influenza, High Dose Seasonal PF 11/08/2014, 11/06/2015, 11/09/2016, 10/28/2017  . Influenza-Unspecified 10/22/2018, 10/28/2019  . PFIZER SARS-COV-2 Vaccination 02/27/2019, 03/20/2019, 10/29/2019  . Pneumococcal Conjugate-13 11/02/2013  . Pneumococcal Polysaccharide-23 11/17/2007  . Td 03/19/1996, 05/14/2008  . Zoster 11/22/2007  . Zoster Recombinat (Shingrix) 01/23/2018, 07/02/2018    Health Maintenance  Topic Date Due  . Hepatitis C Screening  Never done  . DEXA SCAN  12/10/2019  . TETANUS/TDAP  12/24/2020 (Originally 05/15/2018)  . INFLUENZA VACCINE  Completed  . COVID-19 Vaccine  Completed  . PNA vac Low Risk Adult  Completed     Discussed health benefits of physical activity, and encouraged her to engage in regular exercise appropriate for her age and condition.    1. Annual physical exam Normal physical exam today. Will check labs as below and f/u pending lab results. If labs are stable and WNL she will not need to have these rechecked for one year at her next annual physical exam. She is to call the office in the meantime if she has any acute issue, questions or concerns.  2. Colon cancer screening Patient previous colonoscopy report from 2016 had overall normal colonoscopy but to repeat in 5 years. Will refer to GI as below. - Ambulatory referral to Gastroenterology  3. Essential (primary)  hypertension Elevated. Diet controlled. Will check labs as below and f/u pending results. - CBC with Differential/Platelet - Comprehensive metabolic panel - Lipid panel - Hemoglobin A1c  4. Hypothyroidism, postop Will check labs as below and f/u pending results. - TSH  5. Osteopenia of multiple sites Due for BMD. Last was 2016 with osteopenia.  - DG Bone Density; Future - Vitamin D (25 hydroxy)  6. Hypercholesteremia Stable on Atorvastatin 10mg . Will check labs as below and f/u pending results. - CBC with Differential/Platelet - Comprehensive metabolic panel - Lipid panel - Hemoglobin A1c  7. Abnormal blood sugar Diet controlled. Will check labs as below and f/u pending results. - CBC with Differential/Platelet - Comprehensive metabolic panel - Lipid panel - Hemoglobin A1c  8. Avitaminosis D H/O this and postmenopausal. Will check labs as below and f/u pending results. - CBC with Differential/Platelet - DG Bone Density; Future - Vitamin D (25 hydroxy)  9. Postmenopausal estrogen deficiency See above medical treatment plan. - DG Bone Density; Future - Vitamin D (25 hydroxy)  10. Encounter for hepatitis C screening test for low risk patient Will check labs as below and f/u pending results. - Hepatitis C Antibody   Return in about 1 year (around 12/24/2020).     14/01/2020, PA-C, have reviewed all documentation for this visit. The documentation on 12/26/19 for the exam, diagnosis, procedures, and orders are all accurate and complete.   14/02/21  [facility] [phone] (phone) [phone] (fax)  [facility]

## 2019-12-25 ENCOUNTER — Ambulatory Visit (INDEPENDENT_AMBULATORY_CARE_PROVIDER_SITE_OTHER): Payer: Medicare PPO | Admitting: Physician Assistant

## 2019-12-25 ENCOUNTER — Encounter: Payer: Self-pay | Admitting: Physician Assistant

## 2019-12-25 ENCOUNTER — Other Ambulatory Visit: Payer: Self-pay

## 2019-12-25 VITALS — BP 173/90 | HR 88 | Temp 98.5°F | Resp 16 | Ht 65.5 in | Wt 150.5 lb

## 2019-12-25 DIAGNOSIS — Z1211 Encounter for screening for malignant neoplasm of colon: Secondary | ICD-10-CM

## 2019-12-25 DIAGNOSIS — E7800 Pure hypercholesterolemia, unspecified: Secondary | ICD-10-CM

## 2019-12-25 DIAGNOSIS — E559 Vitamin D deficiency, unspecified: Secondary | ICD-10-CM

## 2019-12-25 DIAGNOSIS — M8589 Other specified disorders of bone density and structure, multiple sites: Secondary | ICD-10-CM

## 2019-12-25 DIAGNOSIS — I10 Essential (primary) hypertension: Secondary | ICD-10-CM

## 2019-12-25 DIAGNOSIS — E890 Postprocedural hypothyroidism: Secondary | ICD-10-CM

## 2019-12-25 DIAGNOSIS — R7309 Other abnormal glucose: Secondary | ICD-10-CM

## 2019-12-25 DIAGNOSIS — Z0000 Encounter for general adult medical examination without abnormal findings: Secondary | ICD-10-CM | POA: Diagnosis not present

## 2019-12-25 DIAGNOSIS — Z1159 Encounter for screening for other viral diseases: Secondary | ICD-10-CM

## 2019-12-25 DIAGNOSIS — Z780 Asymptomatic menopausal state: Secondary | ICD-10-CM

## 2019-12-25 NOTE — Patient Instructions (Signed)
Shoulder Exercises Ask your health care provider which exercises are safe for you. Do exercises exactly as told by your health care provider and adjust them as directed. It is normal to feel mild stretching, pulling, tightness, or discomfort as you do these exercises. Stop right away if you feel sudden pain or your pain gets worse. Do not begin these exercises until told by your health care provider. Stretching exercises External rotation and abduction This exercise is sometimes called corner stretch. This exercise rotates your arm outward (external rotation) and moves your arm out from your body (abduction). 1. Stand in a doorway with one of your feet slightly in front of the other. This is called a staggered stance. If you cannot reach your forearms to the door frame, stand facing a corner of a room. 2. Choose one of the following positions as told by your health care provider: ? Place your hands and forearms on the door frame above your head. ? Place your hands and forearms on the door frame at the height of your head. ? Place your hands on the door frame at the height of your elbows. 3. Slowly move your weight onto your front foot until you feel a stretch across your chest and in the front of your shoulders. Keep your head and chest upright and keep your abdominal muscles tight. 4. Hold for __________ seconds. 5. To release the stretch, shift your weight to your back foot. Repeat __________ times. Complete this exercise __________ times a day. Extension, standing 1. Stand and hold a broomstick, a cane, or a similar object behind your back. ? Your hands should be a little wider than shoulder width apart. ? Your palms should face away from your back. 2. Keeping your elbows straight and your shoulder muscles relaxed, move the stick away from your body until you feel a stretch in your shoulders (extension). ? Avoid shrugging your shoulders while you move the stick. Keep your shoulder blades tucked  down toward the middle of your back. 3. Hold for __________ seconds. 4. Slowly return to the starting position. Repeat __________ times. Complete this exercise __________ times a day. Range-of-motion exercises Pendulum  1. Stand near a wall or a surface that you can hold onto for balance. 2. Bend at the waist and let your left / right arm hang straight down. Use your other arm to support you. Keep your back straight and do not lock your knees. 3. Relax your left / right arm and shoulder muscles, and move your hips and your trunk so your left / right arm swings freely. Your arm should swing because of the motion of your body, not because you are using your arm or shoulder muscles. 4. Keep moving your hips and trunk so your arm swings in the following directions, as told by your health care provider: ? Side to side. ? Forward and backward. ? In clockwise and counterclockwise circles. 5. Continue each motion for __________ seconds, or for as long as told by your health care provider. 6. Slowly return to the starting position. Repeat __________ times. Complete this exercise __________ times a day. Shoulder flexion, standing  1. Stand and hold a broomstick, a cane, or a similar object. Place your hands a little more than shoulder width apart on the object. Your left / right hand should be palm up, and your other hand should be palm down. 2. Keep your elbow straight and your shoulder muscles relaxed. Push the stick up with your healthy arm to   raise your left / right arm in front of your body, and then over your head until you feel a stretch in your shoulder (flexion). ? Avoid shrugging your shoulder while you raise your arm. Keep your shoulder blade tucked down toward the middle of your back. 3. Hold for __________ seconds. 4. Slowly return to the starting position. Repeat __________ times. Complete this exercise __________ times a day. Shoulder abduction, standing 1. Stand and hold a broomstick,  a cane, or a similar object. Place your hands a little more than shoulder width apart on the object. Your left / right hand should be palm up, and your other hand should be palm down. 2. Keep your elbow straight and your shoulder muscles relaxed. Push the object across your body toward your left / right side. Raise your left / right arm to the side of your body (abduction) until you feel a stretch in your shoulder. ? Do not raise your arm above shoulder height unless your health care provider tells you to do that. ? If directed, raise your arm over your head. ? Avoid shrugging your shoulder while you raise your arm. Keep your shoulder blade tucked down toward the middle of your back. 3. Hold for __________ seconds. 4. Slowly return to the starting position. Repeat __________ times. Complete this exercise __________ times a day. Internal rotation  1. Place your left / right hand behind your back, palm up. 2. Use your other hand to dangle an exercise band, a towel, or a similar object over your shoulder. Grasp the band with your left / right hand so you are holding on to both ends. 3. Gently pull up on the band until you feel a stretch in the front of your left / right shoulder. The movement of your arm toward the center of your body is called internal rotation. ? Avoid shrugging your shoulder while you raise your arm. Keep your shoulder blade tucked down toward the middle of your back. 4. Hold for __________ seconds. 5. Release the stretch by letting go of the band and lowering your hands. Repeat __________ times. Complete this exercise __________ times a day. Strengthening exercises External rotation  1. Sit in a stable chair without armrests. 2. Secure an exercise band to a stable object at elbow height on your left / right side. 3. Place a soft object, such as a folded towel or a small pillow, between your left / right upper arm and your body to move your elbow about 4 inches (10 cm) away  from your side. 4. Hold the end of the exercise band so it is tight and there is no slack. 5. Keeping your elbow pressed against the soft object, slowly move your forearm out, away from your abdomen (external rotation). Keep your body steady so only your forearm moves. 6. Hold for __________ seconds. 7. Slowly return to the starting position. Repeat __________ times. Complete this exercise __________ times a day. Shoulder abduction  1. Sit in a stable chair without armrests, or stand up. 2. Hold a __________ weight in your left / right hand, or hold an exercise band with both hands. 3. Start with your arms straight down and your left / right palm facing in, toward your body. 4. Slowly lift your left / right hand out to your side (abduction). Do not lift your hand above shoulder height unless your health care provider tells you that this is safe. ? Keep your arms straight. ? Avoid shrugging your shoulder while you   do this movement. Keep your shoulder blade tucked down toward the middle of your back. 5. Hold for __________ seconds. 6. Slowly lower your arm, and return to the starting position. Repeat __________ times. Complete this exercise __________ times a day. Shoulder extension 1. Sit in a stable chair without armrests, or stand up. 2. Secure an exercise band to a stable object in front of you so it is at shoulder height. 3. Hold one end of the exercise band in each hand. Your palms should face each other. 4. Straighten your elbows and lift your hands up to shoulder height. 5. Step back, away from the secured end of the exercise band, until the band is tight and there is no slack. 6. Squeeze your shoulder blades together as you pull your hands down to the sides of your thighs (extension). Stop when your hands are straight down by your sides. Do not let your hands go behind your body. 7. Hold for __________ seconds. 8. Slowly return to the starting position. Repeat __________ times.  Complete this exercise __________ times a day. Shoulder row 1. Sit in a stable chair without armrests, or stand up. 2. Secure an exercise band to a stable object in front of you so it is at waist height. 3. Hold one end of the exercise band in each hand. Position your palms so that your thumbs are facing the ceiling (neutral position). 4. Bend each of your elbows to a 90-degree angle (right angle) and keep your upper arms at your sides. 5. Step back until the band is tight and there is no slack. 6. Slowly pull your elbows back behind you. 7. Hold for __________ seconds. 8. Slowly return to the starting position. Repeat __________ times. Complete this exercise __________ times a day. Shoulder press-ups  1. Sit in a stable chair that has armrests. Sit upright, with your feet flat on the floor. 2. Put your hands on the armrests so your elbows are bent and your fingers are pointing forward. Your hands should be about even with the sides of your body. 3. Push down on the armrests and use your arms to lift yourself off the chair. Straighten your elbows and lift yourself up as much as you comfortably can. ? Move your shoulder blades down, and avoid letting your shoulders move up toward your ears. ? Keep your feet on the ground. As you get stronger, your feet should support less of your body weight as you lift yourself up. 4. Hold for __________ seconds. 5. Slowly lower yourself back into the chair. Repeat __________ times. Complete this exercise __________ times a day. Wall push-ups  1. Stand so you are facing a stable wall. Your feet should be about one arm-length away from the wall. 2. Lean forward and place your palms on the wall at shoulder height. 3. Keep your feet flat on the floor as you bend your elbows and lean forward toward the wall. 4. Hold for __________ seconds. 5. Straighten your elbows to push yourself back to the starting position. Repeat __________ times. Complete this exercise  __________ times a day. This information is not intended to replace advice given to you by your health care provider. Make sure you discuss any questions you have with your health care provider. Document Revised: 05/04/2018 Document Reviewed: 02/09/2018 Elsevier Patient Education  2020 [employer]  

## 2019-12-26 ENCOUNTER — Telehealth: Payer: Self-pay

## 2019-12-26 ENCOUNTER — Encounter: Payer: Self-pay | Admitting: Physician Assistant

## 2019-12-26 LAB — COMPREHENSIVE METABOLIC PANEL
ALT: 22 IU/L (ref 0–32)
AST: 24 IU/L (ref 0–40)
Albumin/Globulin Ratio: 1.8 (ref 1.2–2.2)
Albumin: 4.6 g/dL (ref 3.7–4.7)
Alkaline Phosphatase: 82 IU/L (ref 44–121)
BUN/Creatinine Ratio: 14 (ref 12–28)
BUN: 12 mg/dL (ref 8–27)
Bilirubin Total: 1.3 mg/dL — ABNORMAL HIGH (ref 0.0–1.2)
CO2: 25 mmol/L (ref 20–29)
Calcium: 10.1 mg/dL (ref 8.7–10.3)
Chloride: 104 mmol/L (ref 96–106)
Creatinine, Ser: 0.86 mg/dL (ref 0.57–1.00)
GFR calc Af Amer: 75 mL/min/{1.73_m2} (ref >59)
GFR calc non Af Amer: 65 mL/min/{1.73_m2} (ref >59)
Globulin, Total: 2.5 g/dL (ref 1.5–4.5)
Glucose: 106 mg/dL — ABNORMAL HIGH (ref 65–99)
Potassium: 4.2 mmol/L (ref 3.5–5.2)
Sodium: 142 mmol/L (ref 134–144)
Total Protein: 7.1 g/dL (ref 6.0–8.5)

## 2019-12-26 LAB — CBC WITH DIFFERENTIAL/PLATELET
Basophils Absolute: 0.1 10*3/uL (ref 0.0–0.2)
Basos: 1 %
EOS (ABSOLUTE): 0.1 10*3/uL (ref 0.0–0.4)
Eos: 2 %
Hematocrit: 42.1 % (ref 34.0–46.6)
Hemoglobin: 14.5 g/dL (ref 11.1–15.9)
Immature Grans (Abs): 0 10*3/uL (ref 0.0–0.1)
Immature Granulocytes: 0 %
Lymphocytes Absolute: 1.1 10*3/uL (ref 0.7–3.1)
Lymphs: 19 %
MCH: 30.9 pg (ref 26.6–33.0)
MCHC: 34.4 g/dL (ref 31.5–35.7)
MCV: 90 fL (ref 79–97)
Monocytes Absolute: 0.4 10*3/uL (ref 0.1–0.9)
Monocytes: 7 %
Neutrophils Absolute: 4.2 10*3/uL (ref 1.4–7.0)
Neutrophils: 71 %
Platelets: 222 10*3/uL (ref 150–450)
RBC: 4.7 x10E6/uL (ref 3.77–5.28)
RDW: 12.3 % (ref 11.7–15.4)
WBC: 5.9 10*3/uL (ref 3.4–10.8)

## 2019-12-26 LAB — LIPID PANEL
Chol/HDL Ratio: 2.7 ratio (ref 0.0–4.4)
Cholesterol, Total: 154 mg/dL (ref 100–199)
HDL: 57 mg/dL (ref >39)
LDL Chol Calc (NIH): 78 mg/dL (ref 0–99)
Triglycerides: 105 mg/dL (ref 0–149)
VLDL Cholesterol Cal: 19 mg/dL (ref 5–40)

## 2019-12-26 LAB — HEMOGLOBIN A1C
Est. average glucose Bld gHb Est-mCnc: 126 mg/dL
Hgb A1c MFr Bld: 6 % — ABNORMAL HIGH (ref 4.8–5.6)

## 2019-12-26 LAB — HEPATITIS C ANTIBODY: Hep C Virus Ab: <0.1 s/co ratio (ref 0.0–0.9)

## 2019-12-26 LAB — TSH: TSH: 0.011 u[IU]/mL — ABNORMAL LOW (ref 0.450–4.500)

## 2019-12-26 LAB — VITAMIN D 25 HYDROXY (VIT D DEFICIENCY, FRACTURES): Vit D, 25-Hydroxy: 101 ng/mL — ABNORMAL HIGH (ref 30.0–100.0)

## 2019-12-26 NOTE — Telephone Encounter (Signed)
Pt advised.   Thanks,   -Emileo Semel  

## 2019-12-26 NOTE — Telephone Encounter (Signed)
-----   Message from Margaretann Loveless, [place] sent at 12/26/2019  9:08 AM EST ----- Blood count is normal. Kidney and liver function are normal. Sodium, potassium, and calcium are normal. Cholesterol is normal. A1c did increase slightly from 5.9 to now 6.0. Limit sugars. Thyroid still remains overcorrected. May need adjustments by whomever prescribes this for you. Hepatitis C screen is negative. Vit D is normal.

## 2020-01-02 ENCOUNTER — Encounter: Payer: Self-pay | Admitting: *Deleted

## 2020-01-02 NOTE — Progress Notes (Signed)
Subjective:   Alison Blackwell is a 79 y.o. female who presents for Medicare Annual (Subsequent) preventive examination.  I connected with Jean Rosenthal today by telephone and verified that I am speaking with the correct person using two identifiers. Location patient: home Location provider: work Persons participating in the virtual visit: patient, provider.   I discussed the limitations, risks, security and privacy concerns of performing an evaluation and management service by telephone and the availability of in person appointments. I also discussed with the patient that there may be a patient responsible charge related to this service. The patient expressed understanding and verbally consented to this telephonic visit.    Interactive audio and video telecommunications were attempted between this provider and patient, however failed, due to patient having technical difficulties OR patient did not have access to video capability.  We continued and completed visit with audio only.   Review of Systems    N/A  Cardiac Risk Factors include: advanced age (>41men, >42 women);dyslipidemia     Objective:    There were no vitals filed for this visit. There is no height or weight on file to calculate BMI.  Advanced Directives 01/06/2020 03/17/2015 11/24/2014 11/24/2014  Does Patient Have a Medical Advance Directive? Yes Yes Yes Yes  Type of Estate agent of [place];Living will Living will;Healthcare Power of [employer] Power of [place];Living will -  Copy of Healthcare Power of Attorney in Chart? No - copy requested - - -    Current Medications (verified) Outpatient Encounter Medications as of 01/06/2020  Medication Sig  . aspirin EC 81 MG tablet Take 1 tablet by mouth daily.  Marland Kitchen atorvastatin (LIPITOR) 10 MG tablet TAKE 1 TABLET BY MOUTH EVERY DAY  . Multiple Vitamin (MULTI-VITAMINS) TABS Take 1 tablet by mouth daily.  [name] 3-6-9 CAPS Take 1  capsule by mouth daily.  Marland Kitchen SYNTHROID 50 MCG tablet ONCE DAILY BY MOUTH DO NOT SUBSTITUTE PLEASE  . cyclobenzaprine (FLEXERIL) 5 MG tablet Take 0.5-1 tablets (2.5-5 mg total) by mouth at bedtime. (Patient not taking: No sig reported)   No facility-administered encounter medications on file as of 01/06/2020.    Allergies (verified) Patient has no known allergies.   History: Past Medical History:  Diagnosis Date  . Hyperlipidemia    Past Surgical History:  Procedure Laterality Date  . CHOLECYSTECTOMY  2002  . THYROIDECTOMY  1990   Family History  Problem Relation Age of Onset  . Lung cancer Mother   . Healthy Sister   . Healthy Sister   . Breast cancer Daughter 66  . Breast cancer Maternal Aunt 56  . Healthy Son   . Healthy Daughter   . Breast cancer Maternal Aunt    Social History   Socioeconomic History  . Marital status: Divorced    Spouse name: Not on file  . Number of children: 3  . Years of education: College  . Highest education level: Some college, no degree  Occupational History  . Occupation: Retired  Tobacco Use  . Smoking status: Former Smoker    Types: Cigarettes  . Smokeless tobacco: Never Used  . Tobacco comment: quit > 40 years ago  Vaping Use  . Vaping Use: Never used  Substance and Sexual Activity  . Alcohol use: Not Currently  . Drug use: No  . Sexual activity: Not on file  Other Topics Concern  . Not on file  Social History Narrative  . Not on file   Social Determinants of  Health   Financial Resource Strain: Low Risk   . Difficulty of Paying Living Expenses: Not hard at all  Food Insecurity: No Food Insecurity  . Worried About Programme researcher, broadcasting/film/video in the Last Year: Never true  . Ran Out of Food in the Last Year: Never true  Transportation Needs: No Transportation Needs  . Lack of Transportation (Medical): No  . Lack of Transportation (Non-Medical): No  Physical Activity: Inactive  . Days of Exercise per Week: 0 days  . Minutes of  Exercise per Session: 0 min  Stress: No Stress Concern Present  . Feeling of Stress : Only a little  Social Connections: Moderately Isolated  . Frequency of Communication with Friends and Family: More than three times a week  . Frequency of Social Gatherings with Friends and Family: More than three times a week  . Attends Religious Services: Never  . Active Member of Clubs or Organizations: No  . Attends Banker Meetings: Never  . Marital Status: Living with partner    Tobacco Counseling Counseling given: Not Answered Comment: quit > 40 years ago   Clinical Intake:  Pre-visit preparation completed: Yes  Pain : No/denies pain     Nutritional Risks: None Diabetes: No  How often do you need to have someone help you when you read instructions, pamphlets, or other written materials from your doctor or pharmacy?: 1 - Never  Diabetic? No  Interpreter Needed?: No  Information entered by :: [facility], LPN   Activities of Daily Living In your present state of health, do you have any difficulty performing the following activities: 01/06/2020 12/25/2019  Hearing? N N  Vision? N N  Difficulty concentrating or making decisions? N N  Walking or climbing stairs? N N  Dressing or bathing? N N  Doing errands, shopping? N N  Preparing Food and eating ? N -  Using the Toilet? N -  In the past six months, have you accidently leaked urine? N -  Do you have problems with loss of bowel control? N -  Managing your Medications? N -  Managing your Finances? N -  Housekeeping or managing your Housekeeping? N -  Some recent data might be hidden    Patient Care Team: Margaretann Loveless, PA-C as PCP - General (Family Medicine) Patrecia Pace, Delsa Sale, MD as Attending Physician (Endocrinology) Thereasa Solo [facility])  Indicate any recent Medical Services you may have received from other than [facility] providers in the past year (date may be approximate).     Assessment:    This is a routine wellness examination for Mao.  Hearing/Vision screen No exam data present  Dietary issues and exercise activities discussed: Current Exercise Habits: The patient does not participate in regular exercise at present, Exercise limited by: None identified  Goals    . DIET - INCREASE WATER INTAKE     Recommend to drink at least 6-8 8oz glasses of water per day.      Depression Screen PHQ 2/9 Scores 12/25/2019 12/07/2018 11/27/2017 11/25/2016 11/25/2015 11/24/2014  PHQ - 2 Score 0 0 0 0 0 0  PHQ- 9 Score - - - 0 - -    Fall Risk Fall Risk  01/06/2020 12/25/2019 12/07/2018 11/27/2017 11/25/2016  Falls in the past year? 0 0 1 0 No  Number falls in past yr: 0 0 0 - -  Injury with Fall? 0 0 1 - -  Comment - - Tripped and hurt her mouth and was seen by the  dentist. - -  Follow up - Falls evaluation completed Falls evaluation completed - -    FALL RISK PREVENTION PERTAINING TO THE HOME:  Any stairs in or around the home? Yes  If so, are there any without handrails? No  Home free of loose throw rugs in walkways, pet beds, electrical cords, etc? Yes  Adequate lighting in your home to reduce risk of falls? Yes   ASSISTIVE DEVICES UTILIZED TO PREVENT FALLS:  Life alert? No  Use of a cane, walker or w/c? No  Grab bars in the bathroom? Yes  Shower chair or bench in shower? No  Elevated toilet seat or a handicapped toilet? Yes    Cognitive Function:     6CIT Screen 12/25/2019 12/07/2018  What Year? 0 points 0 points  What month? 0 points 0 points  What time? 0 points 0 points  Count back from 20 0 points 0 points  Months in reverse 0 points 0 points  Repeat phrase 2 points 2 points  Total Score 2 2    Immunizations Immunization History  Administered Date(s) Administered  . Influenza, High Dose Seasonal PF 11/08/2014, 11/06/2015, 11/09/2016, 10/28/2017  . Influenza-Unspecified 10/22/2018, 10/28/2019  . PFIZER SARS-COV-2 Vaccination 02/27/2019, 03/20/2019,  10/29/2019  . Pneumococcal Conjugate-13 11/02/2013  . Pneumococcal Polysaccharide-23 11/17/2007  . Td 03/19/1996, 05/14/2008  . Zoster 11/22/2007  . Zoster Recombinat (Shingrix) 01/23/2018, 07/02/2018    TDAP status: Due, Education has been provided regarding the importance of this vaccine. Advised may receive this vaccine at local pharmacy or Health Dept. Aware to provide a copy of the vaccination record if obtained from local pharmacy or Health Dept. Verbalized acceptance and understanding.  Flu Vaccine status: Up to date  Pneumococcal vaccine status: Up to date  Covid-19 vaccine status: Completed vaccines  Qualifies for Shingles Vaccine? Yes   Zostavax completed Yes   Shingrix Completed?: Yes  Screening Tests Health Maintenance  Topic Date Due  . DEXA SCAN  12/10/2019  . TETANUS/TDAP  12/24/2020 (Originally 05/15/2018)  . INFLUENZA VACCINE  Completed  . COVID-19 Vaccine  Completed  . Hepatitis C Screening  Completed  . PNA vac Low Risk Adult  Completed    Health Maintenance  Health Maintenance Due  Topic Date Due  . DEXA SCAN  12/10/2019    Colorectal cancer screening: No longer required.   Mammogram status: No longer required due to age.  Bone Density status: Ordered 12/25/19. Pt awaiting a call to schedule the appt.   Lung Cancer Screening: (Low Dose CT Chest recommended if Age 61-80 years, 30 pack-year currently smoking OR have quit w/in 15years.) does not qualify.    Additional Screening:  Hepatitis C Screening: Up to date  Vision Screening: Recommended annual ophthalmology exams for early detection of glaucoma and other disorders of the eye. Is the patient up to date with their annual eye exam?  Yes  Who is the provider or what is the name of the office in which the patient attends annual eye exams? Dr Senaida Ores If pt is not established with a provider, would they like to be referred to a provider to establish care? No .   Dental Screening: Recommended  annual dental exams for proper oral hygiene  Community Resource Referral / Chronic Care Management: CRR required this visit?  No   CCM required this visit?  No      Plan:     I have personally reviewed and noted the following in the patient's chart:   . Medical  and social history . Use of alcohol, tobacco or illicit drugs  . Current medications and supplements . Functional ability and status . Nutritional status . Physical activity . Advanced directives . List of other physicians . Hospitalizations, surgeries, and ER visits in previous 12 months . Vitals . Screenings to include cognitive, depression, and falls . Referrals and appointments  In addition, I have reviewed and discussed with patient certain preventive protocols, quality metrics, and best practice recommendations. A written personalized care plan for preventive services as well as general preventive health recommendations were provided to patient.     Glynn Yepes [place], [place]   16/10/960412/13/2021   Nurse Notes: None.

## 2020-01-06 ENCOUNTER — Other Ambulatory Visit: Payer: Self-pay

## 2020-01-06 ENCOUNTER — Ambulatory Visit (INDEPENDENT_AMBULATORY_CARE_PROVIDER_SITE_OTHER): Payer: Medicare PPO

## 2020-01-06 DIAGNOSIS — Z0000 Encounter for general adult medical examination without abnormal findings: Secondary | ICD-10-CM | POA: Diagnosis not present

## 2020-01-06 NOTE — Patient Instructions (Signed)
[name] , Thank you for taking time to come for your Medicare Wellness Visit. I appreciate your ongoing commitment to your health goals. Please review the following plan we discussed and let me know if I can assist you in the future.   Screening recommendations/referrals: Colonoscopy: No longer required.  Mammogram: No longer required.  Bone Density: Currently due, order on file. Pt awaiting a call to schedule apt.  Recommended yearly ophthalmology/optometry visit for glaucoma screening and checkup Recommended yearly dental visit for hygiene and checkup  Vaccinations: Influenza vaccine: Done 10/28/19 Pneumococcal vaccine: Completed series Tdap vaccine: Currently due, declined receiving this.  Shingles vaccine: Completed series    Advanced directives: Please bring a copy of your POA (Power of Attorney) and/or Living Will to your next appointment.   Conditions/risks identified: Recommend to drink at least 6-8 8oz glasses of water per day.  Next appointment: 01/11/21 @ 11:00 AM for an AWV. Declined scheduling a follow up with PCP at this time.    Preventive Care 78 Years and Older, Female Preventive care refers to lifestyle choices and visits with your health care provider that can promote health and wellness. What does preventive care include?  A yearly physical exam. This is also called an annual well check.  Dental exams once or twice a year.  Routine eye exams. Ask your health care provider how often you should have your eyes checked.  Personal lifestyle choices, including:  Daily care of your teeth and gums.  Regular physical activity.  Eating a healthy diet.  Avoiding tobacco and drug use.  Limiting alcohol use.  Practicing safe sex.  Taking low-dose aspirin every day.  Taking vitamin and mineral supplements as recommended by your health care provider. What happens during an annual well check? The services and screenings done by your health care provider  during your annual well check will depend on your age, overall health, lifestyle risk factors, and family history of disease. Counseling  Your health care provider may ask you questions about your:  Alcohol use.  Tobacco use.  Drug use.  Emotional well-being.  Home and relationship well-being.  Sexual activity.  Eating habits.  History of falls.  Memory and ability to understand (cognition).  Work and work Astronomer.  Reproductive health. Screening  You may have the following tests or measurements:  Height, weight, and BMI.  Blood pressure.  Lipid and cholesterol levels. These may be checked every 5 years, or more frequently if you are over 45 years old.  Skin check.  Lung cancer screening. You may have this screening every year starting at age 78 if you have a 30-pack-year history of smoking and currently smoke or have quit within the past 15 years.  Fecal occult blood test (FOBT) of the stool. You may have this test every year starting at age 78.  Flexible sigmoidoscopy or colonoscopy. You may have a sigmoidoscopy every 5 years or a colonoscopy every 10 years starting at age 78.  Hepatitis C blood test.  Hepatitis B blood test.  Sexually transmitted disease (STD) testing.  Diabetes screening. This is done by checking your blood sugar (glucose) after you have not eaten for a while (fasting). You may have this done every 1-3 years.  Bone density scan. This is done to screen for osteoporosis. You may have this done starting at age 78.  Mammogram. This may be done every 1-2 years. Talk to your health care provider about how often you should have regular mammograms. Talk with your health  care provider about your test results, treatment options, and if necessary, the need for more tests. Vaccines  Your health care provider may recommend certain vaccines, such as:  Influenza vaccine. This is recommended every year.  Tetanus, diphtheria, and acellular pertussis  (Tdap, Td) vaccine. You may need a Td booster every 10 years.  Zoster vaccine. You may need this after age 78.  Pneumococcal 13-valent conjugate (PCV13) vaccine. One dose is recommended after age 78.  Pneumococcal polysaccharide (PPSV23) vaccine. One dose is recommended after age 78. Talk to your health care provider about which screenings and vaccines you need and how often you need them. This information is not intended to replace advice given to you by your health care provider. Make sure you discuss any questions you have with your health care provider. Document Released: 02/06/2015 Document Revised: 09/30/2015 Document Reviewed: 11/11/2014 Elsevier Interactive Patient Education  2017 [employer] Prevention in the Home Falls can cause injuries. They can happen to people of all ages. There are many things you can do to make your home safe and to help prevent falls. What can I do on the outside of my home?  Regularly fix the edges of walkways and driveways and fix any cracks.  Remove anything that might make you trip as you walk through a door, such as a raised step or threshold.  Trim any bushes or trees on the path to your home.  Use bright outdoor lighting.  Clear any walking paths of anything that might make someone trip, such as rocks or tools.  Regularly check to see if handrails are loose or broken. Make sure that both sides of any steps have handrails.  Any raised decks and porches should have guardrails on the edges.  Have any leaves, snow, or ice cleared regularly.  Use sand or salt on walking paths during winter.  Clean up any spills in your garage right away. This includes oil or grease spills. What can I do in the bathroom?  Use night lights.  Install grab bars by the toilet and in the tub and shower. Do not use towel bars as grab bars.  Use non-skid mats or decals in the tub or shower.  If you need to sit down in the shower, use a plastic, non-slip  stool.  Keep the floor dry. Clean up any water that spills on the floor as soon as it happens.  Remove soap buildup in the tub or shower regularly.  Attach bath mats securely with double-sided non-slip rug tape.  Do not have throw rugs and other things on the floor that can make you trip. What can I do in the bedroom?  Use night lights.  Make sure that you have a light by your bed that is easy to reach.  Do not use any sheets or blankets that are too big for your bed. They should not hang down onto the floor.  Have a firm chair that has side arms. You can use this for support while you get dressed.  Do not have throw rugs and other things on the floor that can make you trip. What can I do in the kitchen?  Clean up any spills right away.  Avoid walking on wet floors.  Keep items that you use a lot in easy-to-reach places.  If you need to reach something above you, use a strong step stool that has a grab bar.  Keep electrical cords out of the way.  Do not use floor  polish or wax that makes floors slippery. If you must use wax, use non-skid floor wax.  Do not have throw rugs and other things on the floor that can make you trip. What can I do with my stairs?  Do not leave any items on the stairs.  Make sure that there are handrails on both sides of the stairs and use them. Fix handrails that are broken or loose. Make sure that handrails are as long as the stairways.  Check any carpeting to make sure that it is firmly attached to the stairs. Fix any carpet that is loose or worn.  Avoid having throw rugs at the top or bottom of the stairs. If you do have throw rugs, attach them to the floor with carpet tape.  Make sure that you have a light switch at the top of the stairs and the bottom of the stairs. If you do not have them, ask someone to add them for you. What else can I do to help prevent falls?  Wear shoes that:  Do not have high heels.  Have rubber bottoms.  Are  comfortable and fit you well.  Are closed at the toe. Do not wear sandals.  If you use a stepladder:  Make sure that it is fully opened. Do not climb a closed stepladder.  Make sure that both sides of the stepladder are locked into place.  Ask someone to hold it for you, if possible.  Clearly mark and make sure that you can see:  Any grab bars or handrails.  First and last steps.  Where the edge of each step is.  Use tools that help you move around (mobility aids) if they are needed. These include:  Canes.  Walkers.  Scooters.  Crutches.  Turn on the lights when you go into a dark area. Replace any light bulbs as soon as they burn out.  Set up your furniture so you have a clear path. Avoid moving your furniture around.  If any of your floors are uneven, fix them.  If there are any pets around you, be aware of where they are.  Review your medicines with your doctor. Some medicines can make you feel dizzy. This can increase your chance of falling. Ask your doctor what other things that you can do to help prevent falls. This information is not intended to replace advice given to you by your health care provider. Make sure you discuss any questions you have with your health care provider. Document Released: 11/06/2008 Document Revised: 06/18/2015 Document Reviewed: 02/14/2014 Elsevier Interactive Patient Education  2017 [employer].

## 2020-01-24 ENCOUNTER — Other Ambulatory Visit: Payer: Self-pay | Admitting: Physician Assistant

## 2020-01-24 DIAGNOSIS — E7800 Pure hypercholesterolemia, unspecified: Secondary | ICD-10-CM

## 2020-02-06 ENCOUNTER — Other Ambulatory Visit: Payer: Self-pay

## 2020-02-06 ENCOUNTER — Telehealth: Payer: Self-pay

## 2020-02-06 ENCOUNTER — Ambulatory Visit
Admission: RE | Admit: 2020-02-06 | Discharge: 2020-02-06 | Disposition: A | Discharge status: Home / Self Care | Payer: Medicare PPO | Source: Ambulatory Visit | Attending: Physician Assistant | Admitting: Physician Assistant

## 2020-02-06 DIAGNOSIS — Z780 Asymptomatic menopausal state: Secondary | ICD-10-CM | POA: Diagnosis present

## 2020-02-06 DIAGNOSIS — M8589 Other specified disorders of bone density and structure, multiple sites: Secondary | ICD-10-CM | POA: Diagnosis present

## 2020-02-06 DIAGNOSIS — E559 Vitamin D deficiency, unspecified: Secondary | ICD-10-CM

## 2020-02-06 NOTE — Telephone Encounter (Signed)
-----   Message from Margaretann Loveless, [place] sent at 02/06/2020  1:17 PM EST ----- Bone density did decrease from -1.4 to -1.8 over the last 6 years. This is still in an osteopenia finding. Continue weight bearing exercises such as walking or yoga. Also adding calcium 1200mg  daily and Vit D 800 IU daily can help bone health as well. Would still repeat bone density in 5 years if desired.

## 2020-02-06 NOTE — Telephone Encounter (Signed)
Pt advised.   Thanks,   -Kerina Simoneau  

## 2020-06-30 ENCOUNTER — Other Ambulatory Visit: Payer: Self-pay | Admitting: *Deleted

## 2020-06-30 ENCOUNTER — Telehealth: Payer: Self-pay

## 2020-06-30 DIAGNOSIS — Z1231 Encounter for screening mammogram for malignant neoplasm of breast: Secondary | ICD-10-CM

## 2020-06-30 NOTE — Telephone Encounter (Signed)
yes

## 2020-06-30 NOTE — Progress Notes (Signed)
MM ordered °

## 2020-06-30 NOTE — Telephone Encounter (Signed)
Okay to order?

## 2020-06-30 NOTE — Telephone Encounter (Signed)
Copied from CRM [id number]. Topic: Referral - Request for Referral >> Jun 29, 2020  2:37 PM Marylen Ponto wrote: Has patient seen PCP for this complaint? no *If NO, is insurance requiring patient see PCP for this issue before PCP can refer them? Referral for which specialty: mammogram Preferred provider/office: [facility] Reason for referral: pt requests referral for mammogram

## 2020-07-14 ENCOUNTER — Other Ambulatory Visit: Payer: Self-pay | Admitting: Family Medicine

## 2020-07-14 DIAGNOSIS — Z1231 Encounter for screening mammogram for malignant neoplasm of breast: Secondary | ICD-10-CM

## 2020-07-30 ENCOUNTER — Ambulatory Visit
Admission: RE | Admit: 2020-07-30 | Discharge: 2020-07-30 | Disposition: A | Discharge status: Home / Self Care | Payer: Medicare PPO | Source: Ambulatory Visit | Attending: Family Medicine | Admitting: Family Medicine

## 2020-07-30 ENCOUNTER — Other Ambulatory Visit: Payer: Self-pay

## 2020-07-30 DIAGNOSIS — Z1231 Encounter for screening mammogram for malignant neoplasm of breast: Secondary | ICD-10-CM | POA: Insufficient documentation

## 2020-08-16 ENCOUNTER — Other Ambulatory Visit: Payer: Self-pay

## 2020-08-16 ENCOUNTER — Emergency Department: Payer: Medicare PPO

## 2020-08-16 ENCOUNTER — Emergency Department
Admission: EM | Admit: 2020-08-16 | Discharge: 2020-08-16 | Disposition: A | Discharge status: Home / Self Care | Payer: Medicare PPO | Attending: Emergency Medicine | Admitting: Emergency Medicine

## 2020-08-16 DIAGNOSIS — N939 Abnormal uterine and vaginal bleeding, unspecified: Secondary | ICD-10-CM | POA: Diagnosis present

## 2020-08-16 DIAGNOSIS — E039 Hypothyroidism, unspecified: Secondary | ICD-10-CM | POA: Diagnosis not present

## 2020-08-16 DIAGNOSIS — I10 Essential (primary) hypertension: Secondary | ICD-10-CM | POA: Diagnosis not present

## 2020-08-16 DIAGNOSIS — Z87891 Personal history of nicotine dependence: Secondary | ICD-10-CM | POA: Diagnosis not present

## 2020-08-16 DIAGNOSIS — Z7982 Long term (current) use of aspirin: Secondary | ICD-10-CM | POA: Insufficient documentation

## 2020-08-16 DIAGNOSIS — Z79899 Other long term (current) drug therapy: Secondary | ICD-10-CM | POA: Insufficient documentation

## 2020-08-16 LAB — CBC WITH DIFFERENTIAL/PLATELET
Abs Immature Granulocytes: 0.01 10*3/uL (ref 0.00–0.07)
Basophils Absolute: 0.1 10*3/uL (ref 0.0–0.1)
Basophils Relative: 1 %
Eosinophils Absolute: 0.3 10*3/uL (ref 0.0–0.5)
Eosinophils Relative: 4 %
HCT: 45 % (ref 36.0–46.0)
Hemoglobin: 14.9 g/dL (ref 12.0–15.0)
Immature Granulocytes: 0 %
Lymphocytes Relative: 25 %
Lymphs Abs: 1.9 10*3/uL (ref 0.7–4.0)
MCH: 31.5 pg (ref 26.0–34.0)
MCHC: 33.1 g/dL (ref 30.0–36.0)
MCV: 95.1 fL (ref 80.0–100.0)
Monocytes Absolute: 0.5 10*3/uL (ref 0.1–1.0)
Monocytes Relative: 7 %
Neutro Abs: 5 10*3/uL (ref 1.7–7.7)
Neutrophils Relative %: 63 %
Platelets: 205 10*3/uL (ref 150–400)
RBC: 4.73 MIL/uL (ref 3.87–5.11)
RDW: 12.4 % (ref 11.5–15.5)
WBC: 7.9 10*3/uL (ref 4.0–10.5)
nRBC: 0 % (ref 0.0–0.2)

## 2020-08-16 LAB — TYPE AND SCREEN
ABO/RH(D): O POS
Antibody Screen: NEGATIVE

## 2020-08-16 LAB — BASIC METABOLIC PANEL
Anion gap: 11 (ref 5–15)
BUN: 12 mg/dL (ref 8–23)
CO2: 24 mmol/L (ref 22–32)
Calcium: 10.2 mg/dL (ref 8.9–10.3)
Chloride: 104 mmol/L (ref 98–111)
Creatinine, Ser: 1.01 mg/dL — ABNORMAL HIGH (ref 0.44–1.00)
GFR, Estimated: 57 mL/min — ABNORMAL LOW (ref >60)
Glucose, Bld: 163 mg/dL — ABNORMAL HIGH (ref 70–99)
Potassium: 3.6 mmol/L (ref 3.5–5.1)
Sodium: 139 mmol/L (ref 135–145)

## 2020-08-16 NOTE — ED Notes (Signed)
See triage note  Presents with some vaginal bleeding  States started bleeding yesterday afternoon  Provider in room with pt

## 2020-08-16 NOTE — ED Provider Notes (Signed)
Emergency Medicine Provider Triage Evaluation Note  Alison Blackwell , a 80 y.o. female  was evaluated in triage.  Pt complains of vaginal bleeding.  Began as spotting in the afternoon and progressed to blood clots by tonight; no history of same.  Review of Systems  Positive: Vaginal bleeding Negative: Lightheadedness  Physical Exam  BP (!) 165/107   Pulse (!) 101   Temp 98.4 F (36.9 C) (Oral)   Resp 16   Ht 5\' 5"  (1.651 m)   Wt 68.9 kg   SpO2 100%   BMI 25.29 kg/m  Gen:   Awake, no distress   Resp:  Normal effort  MSK:   Moves extremities without difficulty  Other:    Medical Decision Making  Medically screening exam initiated at 1:25 AM.  Appropriate orders placed.  Alison Blackwell was informed that the remainder of the evaluation will be completed by another provider, this initial triage assessment does not replace that evaluation, and the importance of remaining in the ED until their evaluation is complete.  80 year old female presenting with postmenopausal vaginal bleeding.  Will obtain lab work, type and screen and proceed with pelvic ultrasound to evaluate etiology of patient's symptoms.  Patient awaiting treatment room.   77, MD 08/16/20 [phone]

## 2020-08-16 NOTE — ED Provider Notes (Signed)
[facility] Emergency Department Provider Note  ____________________________________________   Event Date/Time   First MD Initiated Contact with Patient 08/16/20 [id number]     (approximate)  I have reviewed the triage vital signs and the nursing notes.   HISTORY  Chief Complaint Vaginal Bleeding   HPI Alison Blackwell is a 80 y.o. female past medical history of HDL and hypothyroidism on Synthroid who presents for assessment of some vaginal bleeding she noticed starting last night.  States she has passed several clots of blood since then.  She denies any abdominal pain, back pain, burning with urination, vomiting, diarrhea, blood in her urine or stool or other abnormal vaginal discharge.  No other recent vaginal bleeding.  No other recent sick symptoms including headache, earache, sore throat, chest pain, cough, shortness of breath, rash or injuries.  No insertion of any foreign bodies.  No other acute concerns at this time.         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 Authorizing Provider  aspirin EC 81 MG tablet  Take 1 tablet by mouth daily.    [provider]  atorvastatin (LIPITOR) 10 MG tablet TAKE 1 TABLET BY MOUTH EVERY DAY 01/24/20   Margaretann Loveless, PA-C  cyclobenzaprine (FLEXERIL) 5 MG tablet Take 0.5-1 tablets (2.5-5 mg total) by mouth at bedtime. Patient not taking: No sig reported 11/10/16   Margaretann Loveless, PA-C  Multiple Vitamin (MULTI-VITAMINS) TABS Take 1 tablet by mouth daily.    [provider]  Omega 3-6-9 CAPS Take 1 capsule by mouth daily.    [provider]  SYNTHROID 50 MCG tablet ONCE DAILY BY MOUTH DO NOT SUBSTITUTE PLEASE 10/25/14   [provider]    Allergies Patient has no known allergies.  Family History  Problem Relation Age of Onset   Lung cancer Mother    Healthy Sister    Healthy Sister    Breast cancer Daughter 71   Breast cancer Maternal Aunt 52   Healthy Son    Healthy Daughter    Breast cancer Maternal Aunt     Social History Social History   Tobacco Use   Smoking status: Former    Types: Cigarettes   Smokeless tobacco: Never   Tobacco comments:    quit > 40 years ago  Vaping Use   Vaping Use: Never used  Substance Use Topics   Alcohol use: Not Currently   Drug use: No    Review of Systems  Review of Systems  Constitutional:  Negative for chills and fever.  HENT:  Negative for sore throat.   Eyes:  Negative  for pain.  Respiratory:  Negative for cough and stridor.   Cardiovascular:  Negative for chest pain.  Gastrointestinal:  Negative for vomiting.  Genitourinary:  Negative for dysuria.  Musculoskeletal:  Negative for myalgias.  Skin:  Negative for rash.  Neurological:  Negative for seizures, loss of consciousness and headaches.  Psychiatric/Behavioral:  Negative for suicidal ideas.   All other systems reviewed and are negative.    ____________________________________________   PHYSICAL EXAM:  VITAL SIGNS: ED Triage Vitals [08/16/20 0122]  Enc Vitals Group     BP (!) 165/107      Pulse Rate (!) 101     Resp 16     Temp 98.4 F (36.9 C)     Temp Source Oral     SpO2 100 %     Weight 152 lb (68.9 kg)     Height 5\' 5"  (1.651 m)     Head Circumference      Peak Flow      Pain Score 0     Pain Loc      Pain Edu?      Excl. in GC?    Vitals:   08/16/20 0122 08/16/20 0532  BP: (!) 165/107 (!) 157/86  Pulse: (!) 101 96  Resp: 16 17  Temp: 98.4 F (36.9 C) 98.5 F (36.9 C)  SpO2: 100% 98%   Physical Exam Vitals and nursing note reviewed.  Constitutional:      General: She is not in acute distress.    Appearance: She is well-developed.  HENT:     Head: Normocephalic and atraumatic.     Right Ear: External ear normal.     Left Ear: External ear normal.     Nose: Nose normal.  Eyes:     Conjunctiva/sclera: Conjunctivae normal.  Cardiovascular:     Rate and Rhythm: Normal rate and regular rhythm.     Heart sounds: No murmur heard. Pulmonary:     Effort: Pulmonary effort is normal. No respiratory distress.     Breath sounds: Normal breath sounds.  Abdominal:     Palpations: Abdomen is soft.     Tenderness: There is no abdominal tenderness.  Musculoskeletal:     Cervical back: Neck supple.  Skin:    General: Skin is warm and dry.     Capillary Refill: Capillary refill takes less than 2 seconds.  Neurological:     Mental Status: She is alert and oriented to person, place, and time.     ____________________________________________   LABS (all labs ordered are listed, but only abnormal results are displayed)  Labs Reviewed  BASIC METABOLIC PANEL - Abnormal; Notable for the following components:      Result Value   Glucose, Bld 163 (*)    Creatinine, Ser 1.01 (*)    GFR, Estimated 57 (*)    All other components within normal limits  CBC WITH DIFFERENTIAL/PLATELET  TYPE AND SCREEN   ____________________________________________  EKG   ____________________________________________  RADIOLOGY  ED MD interpretation: Pelvic ultrasound  shows thickened heterogenous endometrium without obvious fibroid polyp or other clear acute abnormality.  Official radiology report(s): 08/18/20 PELVIC COMPLETE WITH TRANSVAGINAL  Result Date: 08/16/2020 CLINICAL DATA:  Vaginal bleeding. EXAM: TRANSABDOMINAL AND TRANSVAGINAL ULTRASOUND OF PELVIS TECHNIQUE: Both transabdominal and transvaginal ultrasound examinations of the pelvis were performed. Transabdominal technique was performed for global imaging of the pelvis including uterus, ovaries, adnexal regions, and pelvic cul-de-sac. It was necessary to proceed with endovaginal exam following the transabdominal exam to visualize  the endometrium and bilateral ovaries. COMPARISON:  None FINDINGS: Uterus Measurements: 4.7 cm x 3.3 cm x 4.5 cm = volume: 36.2 mL. No fibroids or other mass visualized. Endometrium Thickness: 10.1 mm. The endometrium is diffusely heterogeneous in appearance. No abnormal flow is seen within this region on color Doppler evaluation. Right ovary The right ovary is not visualized. Left ovary The left ovary is not visualized. Other findings No abnormal free fluid. IMPRESSION: 1. Thickened, heterogeneous endometrium. A small amount of blood products within the endometrial canal cannot be excluded. Electronically Signed   By: Aram Candela M.D.   On: 08/16/2020 02:35    ____________________________________________   PROCEDURES  Procedure(s) performed (including Critical Care):  Procedures   ____________________________________________   INITIAL IMPRESSION / ASSESSMENT AND PLAN / ED COURSE      Patient presents with above-stated history exam for assessment of some vaginal bleeding that started last night.  No history of recent vaginal bleeding or any clear associated sick symptoms.  On arrival she is hypertensive with otherwise stable vital signs on room air.  After patient pelvic exam although patient states she does not like at this time in the emergency room she left your  gynecologist anyway even after explained that could potentially be findings on exam relevant to her bleeding.  She has no abdominal tenderness CVA tenderness denies any symptoms to suggest acute anemia.  Differential includes polyp, fibroids, malignancy.  Patient is not anticoagulated.  Pelvic ultrasound shows thickened heterogenous endometrium without obvious fibroid polyp or other clear acute abnormality.  CBC shows no leukocytosis or acute anemia.  Low suspicion for acute infectious process at this time.  Given stable vitals with eyes reassuring exam, work-up stable hemoglobin medication is stable for discharge with close outpatient Follow-up.  Discharged stable condition.  Strict return cautions advised and discussed.       ____________________________________________   FINAL CLINICAL IMPRESSION(S) / ED DIAGNOSES  Final diagnoses:  Vaginal bleeding    Medications - No data to display   ED Discharge Orders     None        Note:  This document was prepared using Dragon voice recognition software and may include unintentional dictation errors.    Gilles Chiquito, MD 08/16/20 [phone]

## 2020-08-16 NOTE — ED Triage Notes (Signed)
Pt states has been having vaginal bleeding since 1500. Pt states is having lots of clots with bleeding.

## 2020-08-24 ENCOUNTER — Encounter: Payer: Self-pay | Admitting: Obstetrics & Gynecology

## 2020-08-24 ENCOUNTER — Other Ambulatory Visit: Payer: Self-pay

## 2020-08-24 ENCOUNTER — Ambulatory Visit: Payer: Medicare PPO | Admitting: Obstetrics & Gynecology

## 2020-08-24 ENCOUNTER — Other Ambulatory Visit (HOSPITAL_COMMUNITY)
Admission: RE | Admit: 2020-08-24 | Discharge: 2020-08-24 | Disposition: A | Discharge status: Home / Self Care | Payer: Medicare PPO | Source: Ambulatory Visit | Attending: Obstetrics & Gynecology | Admitting: Obstetrics & Gynecology

## 2020-08-24 ENCOUNTER — Other Ambulatory Visit (HOSPITAL_COMMUNITY)
Admission: RE | Admit: 2020-08-24 | Discharge: 2020-08-24 | Disposition: A | Discharge status: Home / Self Care | Payer: Medicare PPO | Source: Ambulatory Visit | Attending: Obstetrics and Gynecology | Admitting: Obstetrics and Gynecology

## 2020-08-24 VITALS — BP 130/80 | Ht 65.0 in | Wt 149.0 lb

## 2020-08-24 DIAGNOSIS — N950 Postmenopausal bleeding: Secondary | ICD-10-CM

## 2020-08-24 DIAGNOSIS — N841 Polyp of cervix uteri: Secondary | ICD-10-CM | POA: Insufficient documentation

## 2020-08-24 DIAGNOSIS — Z01419 Encounter for gynecological examination (general) (routine) without abnormal findings: Secondary | ICD-10-CM | POA: Insufficient documentation

## 2020-08-24 NOTE — Progress Notes (Signed)
Postmenopausal Bleeding Patient is a 80 yo [id number] AAF who complains of recent episode of one day of vaginal bleeding. She has been menopausal for  30+  years. Currently on no HRT and no blood thinner. Bleeding is described as  heavy for one day (seen in ER)  and has occurred 1 times. Other menopausal symptoms include: none. Workup to date: pelvic ultrasound.  ES 10 mm.  Menstrual History: OB History     Gravida  4   Para  3   Term      Preterm      AB      Living         SAB      IAB      Ectopic      Multiple      Live Births              PMHx: She  has a past medical history of Hyperlipidemia. Also,  has a past surgical history that includes Cholecystectomy (2002) and Thyroidectomy (1990)., family history includes Breast cancer in her maternal aunt; Breast cancer (age of onset: 51) in her daughter; Breast cancer (age of onset: 30) in her maternal aunt; Healthy in her daughter, sister, sister, and son; Lung cancer in her mother.,  reports that she has quit smoking. Her smoking use included cigarettes. She has never used smokeless tobacco. She reports previous alcohol use. She reports that she does not use drugs.  She has a current medication list which includes the following prescription(s): aspirin ec, atorvastatin, cyclobenzaprine, multi-vitamins, omega 3-6-9, and synthroid. Also, has No Known Allergies.  Review of Systems  Constitutional:  Negative for chills, fever and malaise/fatigue.  HENT:  Negative for congestion, sinus pain and sore throat.   Eyes:  Negative for blurred vision and pain.  Respiratory:  Negative for cough and wheezing.   Cardiovascular:  Negative for chest pain and leg swelling.  Gastrointestinal:  Negative for abdominal pain, constipation, diarrhea, heartburn, nausea and vomiting.  Genitourinary:  Negative for dysuria, frequency, hematuria and urgency.  Musculoskeletal:  Negative for back pain, joint pain, myalgias and neck pain.  Skin:   Negative for itching and rash.  Neurological:  Negative for dizziness, tremors and weakness.  Endo/Heme/Allergies:  Does not bruise/bleed easily.  Psychiatric/Behavioral:  Negative for depression. The patient is not nervous/anxious and does not have insomnia.    Objective: BP 130/80   Ht 5\' 5"  (1.651 m)   Wt 149 lb (67.6 kg)   BMI 24.79 kg/m  Physical Exam Constitutional:      General: She is not in acute distress.    Appearance: She is well-developed.  Genitourinary:     Right Labia: No rash or tenderness.    Left Labia: No tenderness or rash.    No vaginal erythema or bleeding.      Right Adnexa: not tender and no mass present.    Left Adnexa: not tender and no mass present.    No cervical motion tenderness, discharge, polyp or nabothian cyst.     Uterus is not enlarged.     No uterine mass detected.    Pelvic exam was performed with patient in the lithotomy position.  HENT:     Head: Normocephalic and atraumatic.     Nose: Nose normal.  Abdominal:     General: There is no distension.     Palpations: Abdomen is soft.     Tenderness: There is no abdominal tenderness.  Musculoskeletal:  General: Normal range of motion.  Neurological:     Mental Status: She is alert and oriented to person, place, and time.     Cranial Nerves: No cranial nerve deficit.  Skin:    General: Skin is warm and dry.  Psychiatric:        Attention and Perception: Attention normal.        Mood and Affect: Mood and affect normal.        Speech: Speech normal.        Behavior: Behavior normal.        Thought Content: Thought content normal.        Judgment: Judgment normal.    ASSESSMENT/PLAN:   Problem List Items Addressed This Visit     Postmenopausal bleeding    -  Primary   Relevant Orders   Surgical pathology EMB see below   Mucous polyp of cervix       Relevant Orders   Surgical pathology  cervical polypectomy see below   Cytology - PAP  Looking for source of PMB Risks of  cancer discussed. Surgery options based on results as well as if continues to have bleeding episodes     Endometrial Biopsy After discussion with the patient regarding her abnormal uterine bleeding I recommended that she proceed with an endometrial biopsy for further diagnosis. The risks, benefits, alternatives, and indications for an endometrial biopsy were discussed with the patient in detail. She understood the risks including infection, bleeding, cervical laceration and uterine perforation.  Verbal consent was obtained.   PROCEDURE NOTE:  Pipelle endometrial biopsy was performed using aseptic technique with iodine preparation.  The uterus was sounded to a length of 7 cm.  Adequate sampling was obtained with minimal blood loss.  The patient tolerated the procedure well.  Disposition will be pending pathology.  PROCEDURE NOTE: Polypectomy. Cervix visualized and polyp noted.  Ring forcep applied to cervix and twisting motion removed polyp intact.  Hemostasis obtained.  Annamarie Major, MD, [facility] 08/24/2020  4:13 PM

## 2020-08-24 NOTE — Patient Instructions (Signed)

## 2020-08-26 ENCOUNTER — Encounter: Payer: Self-pay | Admitting: Obstetrics and Gynecology

## 2020-08-26 LAB — SURGICAL PATHOLOGY

## 2020-08-27 LAB — CYTOLOGY - PAP: Diagnosis: NEGATIVE

## 2021-01-22 ENCOUNTER — Other Ambulatory Visit: Payer: Self-pay | Admitting: Physician Assistant

## 2021-01-22 DIAGNOSIS — E7800 Pure hypercholesterolemia, unspecified: Secondary | ICD-10-CM

## 2021-03-22 NOTE — Progress Notes (Signed)
[name],acting as a scribe for Alison Mans, MD.,have documented all relevant documentation on the behalf of Alison Mans, MD,as directed by  Alison Mans, MD while in the presence of Alison Mans, MD.    Annual Wellness Visit     Patient: Alison Blackwell, Female    DOB: [birth year], 81 y.o.   MRN: [id number] Visit Date: 03/23/2021  Today's Provider: [name], MD   No chief complaint on file.  Subjective    Alison Blackwell is a 81 y.o. female who presents today for her Annual Wellness Visit.Annual Physical. She reports consuming a general diet. The patient does not participate in regular exercise at present. She generally feels well. She reports sleeping well. She does not have additional problems to discuss today.  Patient has a history of whitecoat hypertension and is followed by Dr. Patrecia Blackwell for chronic thyroid disease. Lives alone, she has 3 children and 1 grandchild who is a Holiday representative at [employer] HPI    Medications: Outpatient Medications Prior to Visit  Medication Sig   aspirin EC 81 MG tablet Take 1 tablet by mouth daily.   atorvastatin (LIPITOR) 10 MG tablet TAKE 1 TABLET BY MOUTH EVERY DAY   cyclobenzaprine (FLEXERIL) 5 MG tablet Take 0.5-1 tablets (2.5-5 mg total) by mouth at bedtime.   Multiple Vitamin (MULTI-VITAMINS) TABS Take 1 tablet by mouth daily.   Omega 3-6-9 CAPS Take 1 capsule by mouth daily.   SYNTHROID 50 MCG tablet ONCE DAILY BY MOUTH DO NOT SUBSTITUTE PLEASE   No facility-administered medications prior to visit.    No Known Allergies  Patient Care Team: Maple Hudson., MD as PCP - General (Family Medicine) Alison Blackwell, Delsa Sale, MD as Attending Physician (Endocrinology) [facility])  Review of Systems  Constitutional: Negative.   HENT:  Positive for sinus pressure.   Eyes: Negative.   Respiratory: Negative.    Cardiovascular: Negative.   Gastrointestinal:  Negative.   Endocrine: Negative.   Genitourinary: Negative.   Musculoskeletal: Negative.   Skin: Negative.   Allergic/Immunologic: Negative.   Neurological: Negative.   Hematological: Negative.   Psychiatric/Behavioral: Negative.         Objective    Vitals: BP (!) 145/90 (BP Location: Left Arm, Patient Position: Sitting, Cuff Size: Normal)    Pulse 87    Temp 98.1 F (36.7 C) (Oral)    Wt 139 lb (63 kg)    SpO2 100%    BMI 23.13 kg/m      Physical Exam Constitutional:      Appearance: Normal appearance. She is normal weight.  HENT:     Head: Normocephalic and atraumatic.     Right Ear: Tympanic membrane, ear canal and external ear normal.     Left Ear: Tympanic membrane, ear canal and external ear normal.     Nose: Nose normal.     Mouth/Throat:     Mouth: Mucous membranes are moist.     Pharynx: Oropharynx is clear.  Eyes:     Extraocular Movements: Extraocular movements intact.     Conjunctiva/sclera: Conjunctivae normal.     Pupils: Pupils are equal, round, and reactive to light.  Cardiovascular:     Rate and Rhythm: Normal rate and regular rhythm.     Pulses: Normal pulses.     Heart sounds: Normal heart sounds.  Pulmonary:     Effort: Pulmonary effort is normal.     Breath sounds: Normal breath sounds.  Abdominal:  Palpations: Abdomen is soft.  Musculoskeletal:     Cervical back: Neck supple.     Right lower leg: No edema.     Left lower leg: No edema.  Skin:    General: Skin is warm and dry.  Neurological:     General: No focal deficit present.     Mental Status: She is alert and oriented to person, place, and time. Mental status is at baseline.  Psychiatric:        Mood and Affect: Mood normal.        Behavior: Behavior normal.        Thought Content: Thought content normal.        Judgment: Judgment normal.     Most recent functional status assessment: In your present state of health, do you have any difficulty performing the following  activities: 03/23/2021  Hearing? N  Vision? N  Difficulty concentrating or making decisions? N  Walking or climbing stairs? N  Dressing or bathing? N  Doing errands, shopping? N  Some recent data might be hidden   Most recent fall risk assessment: Fall Risk  03/23/2021  Falls in the past year? 0  Number falls in past yr: -  Injury with Fall? -  Comment -  Follow up -    Most recent depression screenings: PHQ 2/9 Scores 03/23/2021 12/25/2019  PHQ - 2 Score 0 0  PHQ- 9 Score 0 -   Most recent cognitive screening: 6CIT Screen 12/25/2019  What Year? 0 points  What month? 0 points  What time? 0 points  Count back from 20 0 points  Months in reverse 0 points  Repeat phrase 2 points  Total Score 2   Most recent Audit-C alcohol use screening Alcohol Use Disorder Test (AUDIT) 03/23/2021  1. How often do you have a drink containing alcohol? 0  2. How many drinks containing alcohol do you have on a typical day when you are drinking? -  3. How often do you have six or more drinks on one occasion? -  AUDIT-C Score -  Alcohol Brief Interventions/Follow-up -   A score of 3 or more in women, and 4 or more in men indicates increased risk for alcohol abuse, EXCEPT if all of the points are from question 1   No results found for any visits on 03/23/21.  Assessment & Plan     Annual wellness visit done today including the all of the following: Reviewed patient's Family Medical History Reviewed and updated list of patient's medical providers Assessment of cognitive impairment was done Assessed patient's functional ability Established a written schedule for health screening services Health Risk Assessent Completed and Reviewed  Exercise Activities and Dietary recommendations  Goals      DIET - INCREASE WATER INTAKE     Recommend to drink at least 6-8 8oz glasses of water per day.        Immunization History  Administered Date(s) Administered   Influenza, High Dose Seasonal PF  11/08/2014, 11/06/2015, 11/09/2016, 10/28/2017   Influenza-Unspecified 10/22/2018, 10/28/2019   PFIZER(Purple Top)SARS-COV-2 Vaccination 02/27/2019, 03/20/2019, 10/29/2019   Pneumococcal Conjugate-13 11/02/2013   Pneumococcal Polysaccharide-23 11/17/2007   Td 03/19/1996, 05/14/2008   Zoster Recombinat (Shingrix) 01/23/2018, 07/02/2018   Zoster, Live 11/22/2007    Health Maintenance  Topic Date Due   TETANUS/TDAP  05/15/2018   COVID-19 Vaccine (4 - Booster for Pfizer series) 12/24/2019   INFLUENZA VACCINE  08/24/2020   MAMMOGRAM  07/30/2021   DEXA SCAN  02/05/2025  Pneumonia Vaccine 59+ Years old  Completed   Hepatitis C Screening  Completed   Zoster Vaccines- Shingrix  Completed   HPV VACCINES  Aged Out     Discussed health benefits of physical activity, and encouraged her to engage in regular exercise appropriate for her age and condition.    1. Encounter for Medicare annual wellness exam   2. Annual physical exam  3. Hypothyroidism, postop Followed by Dr. Johny Chess  4. Essential (primary) hypertension Whitecoat hypertension.  Get home blood pressure cuff and bring in readings on next visit  5. Avitaminosis D   6. Osteopenia of multiple sites    No follow-ups on file.     I, Alison Mans, MD, have reviewed all documentation for this visit. The documentation on 03/26/21 for the exam, diagnosis, procedures, and orders are all accurate and complete.    Shawnise Peterkin Wendelyn Breslow, MD  [facility] [phone] (phone) [phone] (fax)  [facility]

## 2021-03-23 ENCOUNTER — Other Ambulatory Visit: Payer: Self-pay

## 2021-03-23 ENCOUNTER — Ambulatory Visit (INDEPENDENT_AMBULATORY_CARE_PROVIDER_SITE_OTHER): Payer: Medicare PPO | Admitting: Family Medicine

## 2021-03-23 VITALS — BP 145/90 | HR 87 | Temp 98.1°F | Wt 139.0 lb

## 2021-03-23 DIAGNOSIS — Z0000 Encounter for general adult medical examination without abnormal findings: Secondary | ICD-10-CM

## 2021-03-23 DIAGNOSIS — M8589 Other specified disorders of bone density and structure, multiple sites: Secondary | ICD-10-CM

## 2021-03-23 DIAGNOSIS — E559 Vitamin D deficiency, unspecified: Secondary | ICD-10-CM

## 2021-03-23 DIAGNOSIS — I10 Essential (primary) hypertension: Secondary | ICD-10-CM | POA: Diagnosis not present

## 2021-03-23 DIAGNOSIS — E890 Postprocedural hypothyroidism: Secondary | ICD-10-CM

## 2021-03-23 NOTE — Patient Instructions (Signed)
Omron Blood Pressure cuff

## 2021-04-23 ENCOUNTER — Other Ambulatory Visit: Payer: Self-pay | Admitting: Family Medicine

## 2021-04-23 DIAGNOSIS — E7800 Pure hypercholesterolemia, unspecified: Secondary | ICD-10-CM

## 2021-04-23 NOTE — Telephone Encounter (Signed)
Requested medication (s) are due for refill today: Yes ? ?Requested medication (s) are on the active medication list: Yes ? ?Last refill:  01/26/21 ? ?Future visit scheduled: Yes ? ?Notes to clinic:  Unable to refill per protocol due to failed labs, no updated results.  ? ? ? ? ?Requested Prescriptions  ?Pending Prescriptions Disp Refills  ? atorvastatin (LIPITOR) 10 MG tablet [Pharmacy Med Name: ATORVASTATIN 10 MG TABLET] 90 tablet 0  ?  Sig: TAKE 1 TABLET BY MOUTH EVERY DAY  ?  ? Cardiovascular:  Antilipid - Statins Failed - 04/23/2021  1:53 AM  ?  ?  Failed - Lipid Panel in normal range within the last 12 months  ?  Cholesterol, Total  ?Date Value Ref Range Status  ?12/25/2019 154 100 - 199 mg/dL Final  ? ?LDL Cholesterol (Calc)  ?Date Value Ref Range Status  ?11/25/2016 87 mg/dL (calc) Final  ?  Comment:  ?  Reference range: <100 ?Marland Kitchen ?Desirable range <100 mg/dL for primary prevention;   ?<70 mg/dL for patients with CHD or diabetic patients  ?with > or = 2 CHD risk factors. ?. ?LDL-C is now calculated using the Martin-Hopkins  ?calculation, which is a validated novel method providing  ?better accuracy than the Friedewald equation in the  ?estimation of LDL-C.  ?Cresenciano Genre et al. Annamaria Helling. MU:[id number]): 2061-2068  ?(http://education.[facility].com/faq/FAQ164) ?  ? ?LDL Chol Calc (NIH)  ?Date Value Ref Range Status  ?12/25/2019 78 0 - 99 mg/dL Final  ? ?HDL  ?Date Value Ref Range Status  ?12/25/2019 57 >39 mg/dL Final  ? ?Triglycerides  ?Date Value Ref Range Status  ?12/25/2019 105 0 - 149 mg/dL Final  ? ?  ?  ?  Passed - Patient is not pregnant  ?  ?  Passed - Valid encounter within last 12 months  ?  Recent Outpatient Visits   ? ?      ? 1 month ago Encounter for [employer] annual wellness exam  ? [facility] Jerrol Banana., MD  ? 1 year ago Annual physical exam  ? [facility] [place], [place], [place]  ? 3 years ago Medicare annual wellness visit, subsequent  ? [place], [place]  ? 3 years ago Seasonal allergic rhinitis due to pollen  ? [place], [place]  ? 4 years ago Medicare annual wellness visit, subsequent  ? [place], [place]  ? ?  ?  ?Future Appointments   ? ?        ? In 5 months Jerrol Banana., MD [facility]  ? ?  ? ?  ?  ?  ? ? ? ? ?

## 2021-07-22 ENCOUNTER — Other Ambulatory Visit: Payer: Self-pay | Admitting: Family Medicine

## 2021-07-22 DIAGNOSIS — E7800 Pure hypercholesterolemia, unspecified: Secondary | ICD-10-CM

## 2021-07-28 ENCOUNTER — Other Ambulatory Visit: Payer: Self-pay | Admitting: Family Medicine

## 2021-07-28 DIAGNOSIS — Z1231 Encounter for screening mammogram for malignant neoplasm of breast: Secondary | ICD-10-CM

## 2021-08-19 ENCOUNTER — Ambulatory Visit
Admission: RE | Admit: 2021-08-19 | Discharge: 2021-08-19 | Disposition: A | Discharge status: Home / Self Care | Payer: Medicare PPO | Source: Ambulatory Visit | Attending: Family Medicine | Admitting: Family Medicine

## 2021-08-19 DIAGNOSIS — Z1231 Encounter for screening mammogram for malignant neoplasm of breast: Secondary | ICD-10-CM | POA: Diagnosis present

## 2021-09-17 NOTE — Progress Notes (Unsigned)
Established patient visit  I,April Miller,acting as a scribe for [name], MD.,have documented all relevant documentation on the behalf of [name], MD,as directed by  [name], MD while in the presence of [name], MD.   Patient: Alison Blackwell   DOB: [birth year]   81 y.o. Female  MRN: [id number] Visit Date: 09/20/2021  Today's healthcare provider: [name], MD   Chief Complaint  Patient presents with   Follow-up   Hypertension   Subjective    HPI  Patient comes in today for follow-up.  She feels well but has had 1 week of "sinus".  She states that for 1 week she has had discomfort around both maxillary sinuses in the lower nose.  No swelling fever chills discharge postnasal drainage. Overall the patient feels very well. Her granddaughter is recently graduated and moved to [place] for a job.   Hypertension, follow-up  BP Readings from Last 3 Encounters:  09/20/21 (!) 153/86  03/23/21 (!) 145/90  08/24/20 130/80   Wt Readings from Last 3 Encounters:  09/20/21 134 lb (60.8 kg)  03/23/21 139 lb (63 kg)  08/24/20 149 lb (67.6 kg)     She was last seen for hypertension 6 months ago.  Management since that visit includes; Whitecoat hypertension.  Get home blood pressure cuff and bring in readings on next visit.  Outside blood pressures are not checking.  Pertinent labs Lab Results  Component Value Date   CHOL 154 12/25/2019   HDL 57 12/25/2019   LDLCALC 78 12/25/2019   TRIG 105 12/25/2019   CHOLHDL 2.7 12/25/2019   Lab Results  Component Value Date   NA 139 08/16/2020   K 3.6 08/16/2020   CREATININE 1.01 (H) 08/16/2020   GFRNONAA 57 (L) 08/16/2020   GLUCOSE 163 (H) 08/16/2020   TSH 0.011 (L) 12/25/2019     The ASCVD Risk score (Arnett DK, et al., 2019) failed to calculate for the following reasons:   The 2019 ASCVD risk score is only valid for ages 79 to  46  ---------------------------------------------------------------------------------------------------   Medications: Outpatient Medications Prior to Visit  Medication Sig   aspirin EC 81 MG tablet Take 1 tablet by mouth daily.   atorvastatin (LIPITOR) 10 MG tablet TAKE 1 TABLET BY MOUTH EVERY DAY   cyclobenzaprine (FLEXERIL) 5 MG tablet Take 0.5-1 tablets (2.5-5 mg total) by mouth at bedtime.   Multiple Vitamin (MULTI-VITAMINS) TABS Take 1 tablet by mouth daily.   Omega 3-6-9 CAPS Take 1 capsule by mouth daily.   SYNTHROID 50 MCG tablet ONCE DAILY BY MOUTH DO NOT SUBSTITUTE PLEASE   No facility-administered medications prior to visit.    Review of Systems  Constitutional:  Negative for appetite change, chills, fatigue and fever.  Respiratory:  Negative for chest tightness and shortness of breath.   Cardiovascular:  Negative for chest pain and palpitations.  Gastrointestinal:  Negative for abdominal pain, nausea and vomiting.  Neurological:  Negative for dizziness and weakness.        Objective    BP (!) 153/86 (BP Location: Left Arm, Patient Position: Sitting, Cuff Size: Normal)   Pulse (!) 102   Resp 16   Wt 134 lb (60.8 kg)   SpO2 100%   BMI 22.30 kg/m  BP Readings from Last 3 Encounters:  09/20/21 (!) 153/86  03/23/21 (!) 145/90  08/24/20 130/80   Wt Readings from Last 3 Encounters:  09/20/21 134 lb (60.8 kg)  03/23/21 139 lb (63 kg)  08/24/20 149 lb (67.6 kg)      Physical Exam Vitals reviewed.  Constitutional:      General: She is not in acute distress.    Appearance: She is well-developed.  HENT:     Head: Normocephalic and atraumatic.     Comments: Sinuses nontender, specifically maxillary sinus    Right Ear: Hearing, tympanic membrane and external ear normal.     Left Ear: Hearing, tympanic membrane and external ear normal.     Nose: Nose normal.     Mouth/Throat:     Pharynx: Oropharynx is clear.  Eyes:     General: Lids are normal. No scleral  icterus.       Right eye: No discharge.        Left eye: No discharge.     Conjunctiva/sclera: Conjunctivae normal.  Cardiovascular:     Rate and Rhythm: Normal rate and regular rhythm.     Heart sounds: Normal heart sounds.  Pulmonary:     Effort: Pulmonary effort is normal. No respiratory distress.  Skin:    Findings: No lesion or rash.  Neurological:     General: No focal deficit present.     Mental Status: She is alert and oriented to person, place, and time.  Psychiatric:        Mood and Affect: Mood normal.        Speech: Speech normal.        Behavior: Behavior normal.        Thought Content: Thought content normal.        Judgment: Judgment normal.       No results found for any visits on 09/20/21.  Assessment & Plan     1. Essential (primary) hypertension Patient did not bring in home blood pressure readings.  She states she will try on the next visit - Lipid panel - TSH - CBC w/Diff/Platelet - Comprehensive Metabolic Panel (CMET)  2. Hypothyroidism, postop Treat for euthyroid TS - Lipid panel - TSH - CBC w/Diff/Platelet - Comprehensive Metabolic Panel (CMET)  3. Hypercholesteremia On atorvastatin- Lipid panel - TSH - CBC w/Diff/Platelet - Comprehensive Metabolic Panel (CMET)  4. Acute non-recurrent maxillary sinusitis Viral URI versus possible allergies. Treat with nasal saline rinses.  No antibiotics indicated  No follow-ups on file.      I, [name], MD, have reviewed all documentation for this visit. The documentation on 09/20/21 for the exam, diagnosis, procedures, and orders are all accurate and complete.    Marijose Curington Wendelyn Breslow, MD  [facility] [phone] (phone) [phone] (fax)  [facility]

## 2021-09-20 ENCOUNTER — Ambulatory Visit: Payer: Medicare PPO | Admitting: Family Medicine

## 2021-09-20 ENCOUNTER — Encounter: Payer: Self-pay | Admitting: Family Medicine

## 2021-09-20 VITALS — BP 153/86 | HR 102 | Resp 16 | Wt 134.0 lb

## 2021-09-20 DIAGNOSIS — E7800 Pure hypercholesterolemia, unspecified: Secondary | ICD-10-CM

## 2021-09-20 DIAGNOSIS — I10 Essential (primary) hypertension: Secondary | ICD-10-CM | POA: Diagnosis not present

## 2021-09-20 DIAGNOSIS — E890 Postprocedural hypothyroidism: Secondary | ICD-10-CM | POA: Diagnosis not present

## 2021-09-20 DIAGNOSIS — J0100 Acute maxillary sinusitis, unspecified: Secondary | ICD-10-CM

## 2021-09-20 NOTE — Patient Instructions (Signed)
TRY SALINE RINSES FOR ONE WEEK.

## 2021-09-21 LAB — LIPID PANEL
Chol/HDL Ratio: 2.5 ratio (ref 0.0–4.4)
Cholesterol, Total: 159 mg/dL (ref 100–199)
HDL: 64 mg/dL (ref >39)
LDL Chol Calc (NIH): 80 mg/dL (ref 0–99)
Triglycerides: 81 mg/dL (ref 0–149)
VLDL Cholesterol Cal: 15 mg/dL (ref 5–40)

## 2021-09-21 LAB — COMPREHENSIVE METABOLIC PANEL
ALT: 13 IU/L (ref 0–32)
AST: 17 IU/L (ref 0–40)
Albumin/Globulin Ratio: 2.1 (ref 1.2–2.2)
Albumin: 4.6 g/dL (ref 3.8–4.8)
Alkaline Phosphatase: 80 IU/L (ref 44–121)
BUN/Creatinine Ratio: 13 (ref 12–28)
BUN: 12 mg/dL (ref 8–27)
Bilirubin Total: 1.4 mg/dL — ABNORMAL HIGH (ref 0.0–1.2)
CO2: 25 mmol/L (ref 20–29)
Calcium: 10.6 mg/dL — ABNORMAL HIGH (ref 8.7–10.3)
Chloride: 104 mmol/L (ref 96–106)
Creatinine, Ser: 0.89 mg/dL (ref 0.57–1.00)
Globulin, Total: 2.2 g/dL (ref 1.5–4.5)
Glucose: 132 mg/dL — ABNORMAL HIGH (ref 70–99)
Potassium: 3.9 mmol/L (ref 3.5–5.2)
Sodium: 144 mmol/L (ref 134–144)
Total Protein: 6.8 g/dL (ref 6.0–8.5)
eGFR: 65 mL/min/{1.73_m2} (ref >59)

## 2021-09-21 LAB — CBC WITH DIFFERENTIAL/PLATELET
Basophils Absolute: 0.1 10*3/uL (ref 0.0–0.2)
Basos: 1 %
EOS (ABSOLUTE): 0.1 10*3/uL (ref 0.0–0.4)
Eos: 2 %
Hematocrit: 40.9 % (ref 34.0–46.6)
Hemoglobin: 13.9 g/dL (ref 11.1–15.9)
Immature Grans (Abs): 0 10*3/uL (ref 0.0–0.1)
Immature Granulocytes: 0 %
Lymphocytes Absolute: 0.9 10*3/uL (ref 0.7–3.1)
Lymphs: 14 %
MCH: 31 pg (ref 26.6–33.0)
MCHC: 34 g/dL (ref 31.5–35.7)
MCV: 91 fL (ref 79–97)
Monocytes Absolute: 0.4 10*3/uL (ref 0.1–0.9)
Monocytes: 7 %
Neutrophils Absolute: 4.5 10*3/uL (ref 1.4–7.0)
Neutrophils: 76 %
Platelets: 210 10*3/uL (ref 150–450)
RBC: 4.49 x10E6/uL (ref 3.77–5.28)
RDW: 12.6 % (ref 11.7–15.4)
WBC: 6 10*3/uL (ref 3.4–10.8)

## 2021-09-21 LAB — TSH: TSH: 0.28 u[IU]/mL — ABNORMAL LOW (ref 0.450–4.500)

## 2021-09-28 NOTE — Telephone Encounter (Signed)
Copied from CRM [id number]. Topic: Appointment Scheduling - Scheduling Inquiry for Clinic >> Sep 28, 2021  3:33 PM Alison Blackwell wrote: Reason for CRM: The patient has called to request that they be reassigned to Dr. Neita Garnet as their PCP   Please contact the patient further to confirm reassignment

## 2021-10-20 ENCOUNTER — Other Ambulatory Visit: Payer: Self-pay | Admitting: Family Medicine

## 2021-10-20 DIAGNOSIS — E7800 Pure hypercholesterolemia, unspecified: Secondary | ICD-10-CM

## 2021-10-20 NOTE — Telephone Encounter (Signed)
Requested Prescriptions  Pending Prescriptions Disp Refills  . atorvastatin (LIPITOR) 10 MG tablet [Pharmacy Med Name: ATORVASTATIN 10 MG TABLET] 90 tablet 0    Sig: TAKE 1 TABLET BY MOUTH EVERY DAY     Cardiovascular:  Antilipid - Statins Failed - 10/20/2021  2:35 AM      Failed - Lipid Panel in normal range within the last 12 months    Cholesterol, Total  Date Value Ref Range Status  09/20/2021 159 100 - 199 mg/dL Final   LDL Cholesterol (Calc)  Date Value Ref Range Status  11/25/2016 87 mg/dL (calc) Final    Comment:    Reference range: <100 . Desirable range <100 mg/dL for primary prevention;   <70 mg/dL for patients with CHD or diabetic patients  with > or = 2 CHD risk factors. . LDL-C is now calculated using the Martin-Hopkins  calculation, which is a validated novel method providing  better accuracy than the Friedewald equation in the  estimation of LDL-C.  Martin SS et al. JAMA. 2013;310(19): 2061-2068  (http://education.[facility].com/faq/FAQ164)    LDL Chol Calc (NIH)  Date Value Ref Range Status  09/20/2021 80 0 - 99 mg/dL Final   HDL  Date Value Ref Range Status  09/20/2021 64 >39 mg/dL Final   Triglycerides  Date Value Ref Range Status  09/20/2021 81 0 - 149 mg/dL Final         Passed - Patient is not pregnant      Passed - Valid encounter within last 12 months    Recent Outpatient Visits          1 month ago Essential (primary) hypertension   [facility] Gilbert, Richard L Jr., MD   7 months ago Encounter for Medicare annual wellness exam   [facility] Gilbert, Richard L Jr., MD   1 year ago Annual physical exam   [facility] Burnette, Jennifer M, PA-C   3 years ago Medicare annual wellness visit, subsequent   [facility] Burnette, Jennifer M, PA-C   4 years ago Seasonal allergic rhinitis due to pollen   [facility] Chauvin, Robert, PA                                                                                                                                                                                                                                                                                                                                                                                                                                                                                                                                                                                                                                                                                                                                                                                                                                                                                                                                                                                                                                                                                                                                                                                                                                                                                                                                            

## 2021-10-20 NOTE — Telephone Encounter (Incomplete)
Requested Prescriptions  Pending Prescriptions Disp Refills  . atorvastatin (LIPITOR) 10 MG tablet [Pharmacy Med Name: ATORVASTATIN 10 MG TABLET] 90 tablet 0    Sig: TAKE 1 TABLET BY MOUTH EVERY DAY     Cardiovascular:  Antilipid - Statins Failed - 10/20/2021  2:35 AM      Failed - Lipid Panel in normal range within the last 12 months    Cholesterol, Total  Date Value Ref Range Status  09/20/2021 159 100 - 199 mg/dL Final   LDL Cholesterol (Calc)  Date Value Ref Range Status  11/25/2016 87 mg/dL (calc) Final    Comment:    Reference range: <100 . Desirable range <100 mg/dL for primary prevention;   <70 mg/dL for patients with CHD or diabetic patients  with > or = 2 CHD risk factors. Marland Kitchen LDL-C is now calculated using the Martin-Hopkins  calculation, which is a validated novel method providing  better accuracy than the Friedewald equation in the  estimation of LDL-C.  Cresenciano Genre et al. Annamaria Helling. 7622;633(35): 2061-2068  (http://education.[facility].com/faq/FAQ164)    LDL Chol Calc (NIH)  Date Value Ref Range Status  09/20/2021 80 0 - 99 mg/dL Final   HDL  Date Value Ref Range Status  09/20/2021 64 >39 mg/dL Final   Triglycerides  Date Value Ref Range Status  09/20/2021 81 0 - 149 mg/dL Final         Passed - Patient is not pregnant      Passed - Valid encounter within last 12 months    Recent Outpatient Visits          1 month ago Essential (primary) hypertension   [place] Jerrol Banana., MD   7 months ago Encounter for [employer] annual wellness exam   [facility] Jerrol Banana., MD   1 year ago Annual physical exam   [facility] [place], [place], [place]   3 years ago Medicare annual wellness visit, subsequent   [place], [place], [place]   4 years ago Seasonal allergic rhinitis due to pollen   [place], [place]

## 2022-01-19 ENCOUNTER — Other Ambulatory Visit: Payer: Self-pay | Admitting: Family Medicine

## 2022-01-19 DIAGNOSIS — E7800 Pure hypercholesterolemia, unspecified: Secondary | ICD-10-CM

## 2022-03-14 ENCOUNTER — Telehealth: Payer: Self-pay | Admitting: Family Medicine

## 2022-03-14 NOTE — Telephone Encounter (Signed)
Contacted Elania P Rufener to schedule their annual wellness visit. Appointment made for 04/05/2022.  [place],  [facility]  [place], [place] [address] Direct Dial: [phone]

## 2022-03-14 NOTE — Telephone Encounter (Signed)
Called patient to schedule Medicare Annual Wellness Visit (AWV). Left message for patient to call back and schedule Medicare Annual Wellness Visit (AWV).  Last date of AWV: 02/282023  Please schedule an appointment at any time with [facility].  If any questions, please contact me at [phone].  Thank you ,  [place], [place], [place] [address] Direct Dial: [email or web address]  Website: [email or web address]

## 2022-03-30 IMAGING — MG MM DIGITAL DIAGNOSTIC UNILAT*L* W/ TOMO W/ CAD
8 series · 8 of 20 positions shown · non-contrast
Comparison: Previous exam(s).

CLINICAL DATA: 79-year-old female presenting as a recall from
screening for left breast calcifications and a left breast
asymmetry.

EXAM:
DIGITAL DIAGNOSTIC LEFT MAMMOGRAM WITH TOMO
ULTRASOUND LEFT BREAST

[L ML]
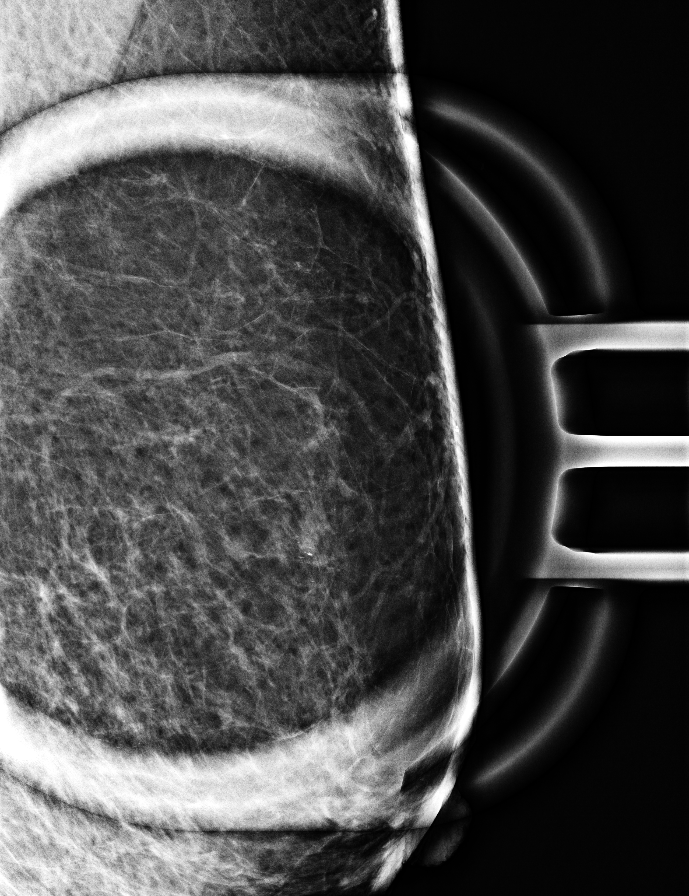

[L CC]
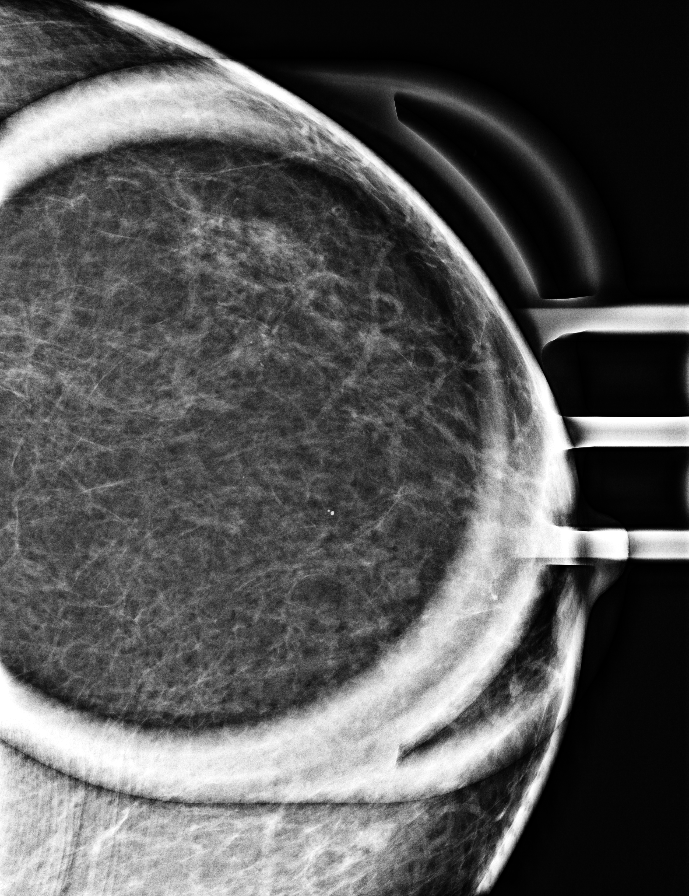

[L MLO synth-2D]
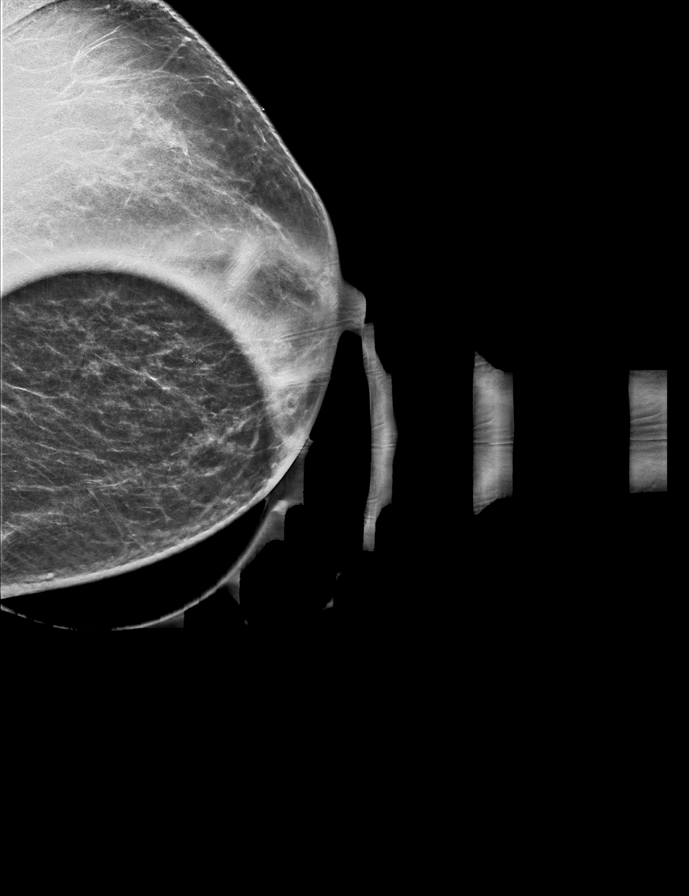

[L CC synth-2D]
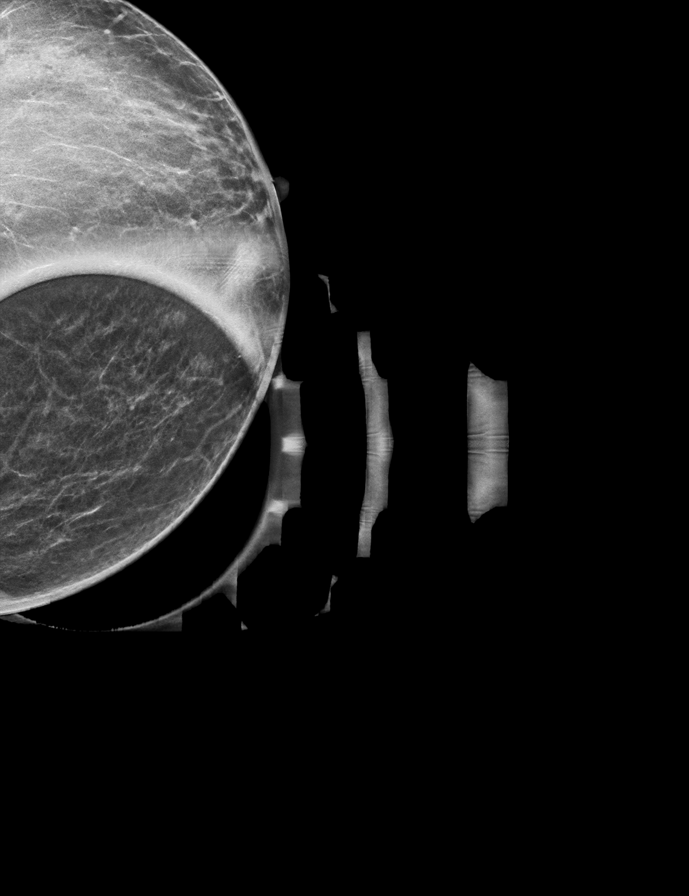

[L ML synth-2D]
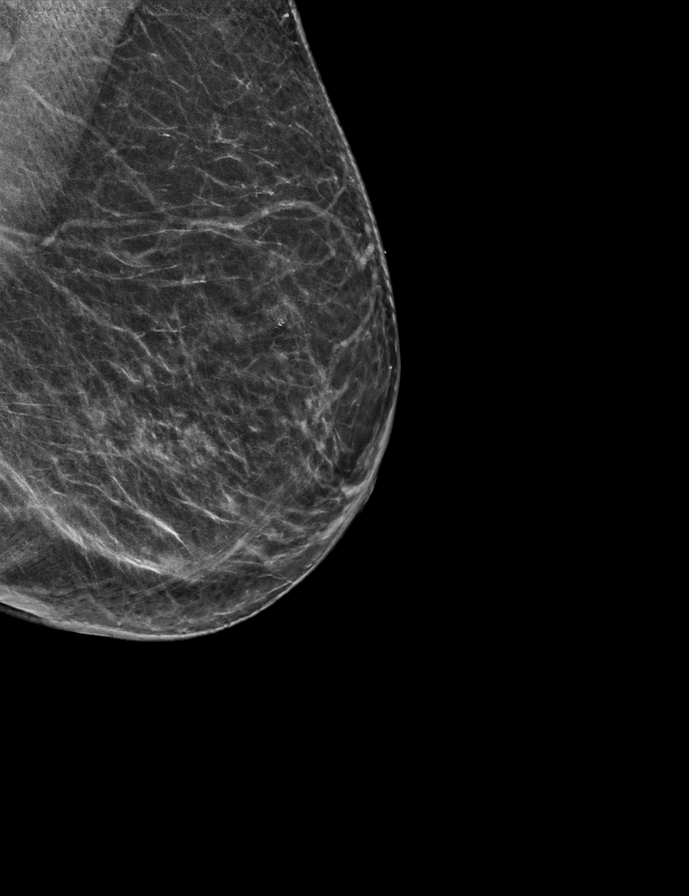

[L MLO tomo · tomo slice 25/50.0]
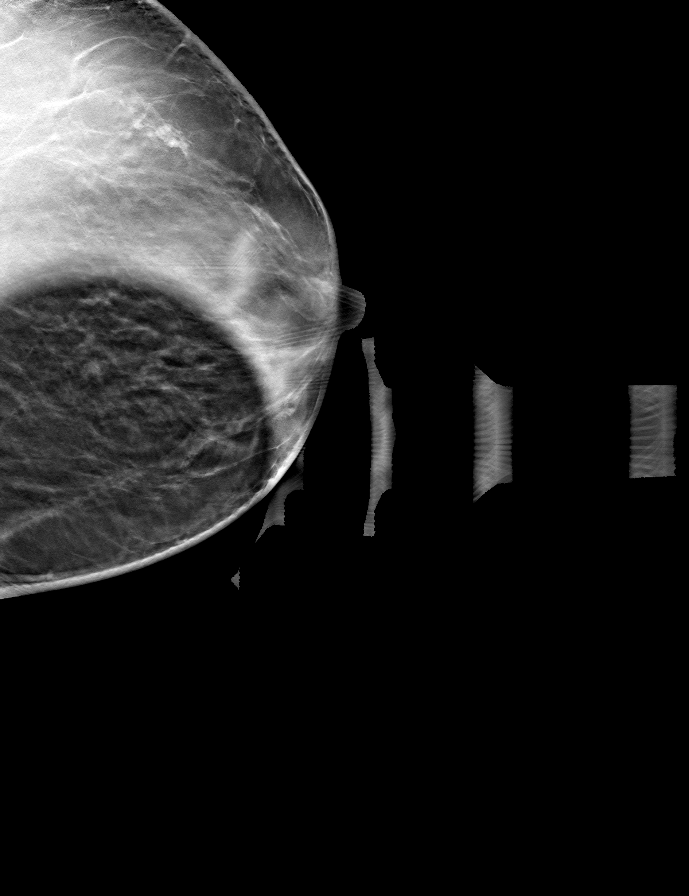

[L CC tomo · tomo slice 23/44.0]
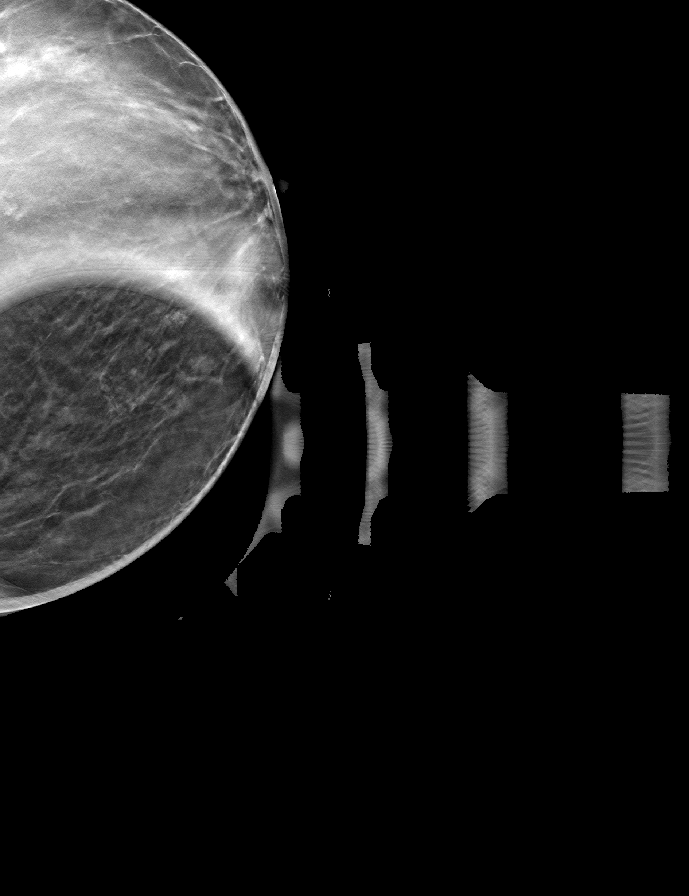

[L ML tomo · tomo slice 31/61.0]
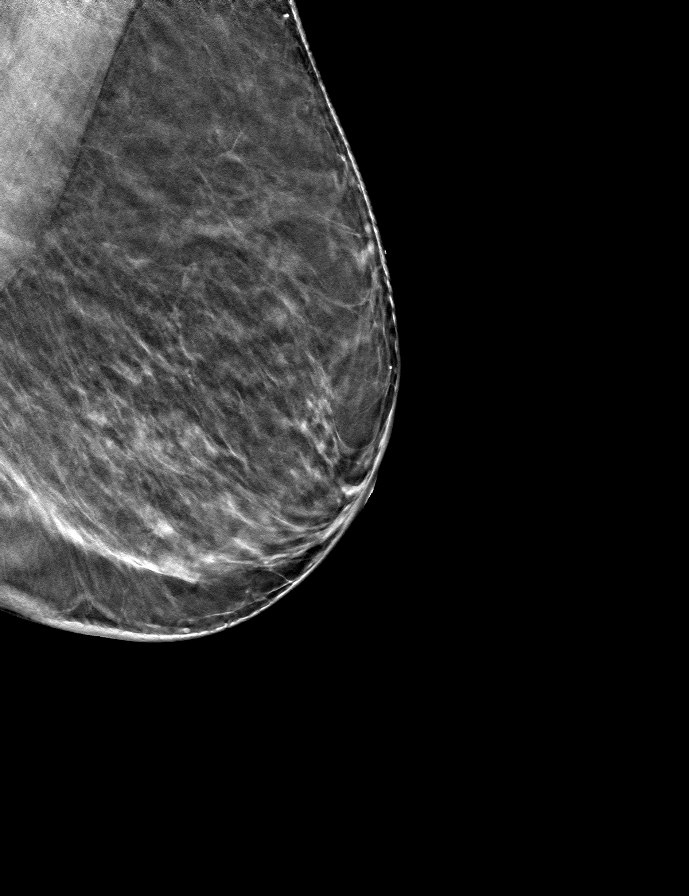

[8 of 20 positions shown; findings below may reference images not displayed]

ACR Breast Density Category c: The breast tissue is heterogeneously
dense, which may obscure small masses.
FINDINGS: Mammogram:

Spot compression tomosynthesis and spot 2D magnification views as
well as full field mL views the left breast were performed.

There are loosely grouped calcifications in the upper outer quadrant
of the left breast which demonstrate layering on lateral view
consistent with benign milk of calcium. There are no suspicious
linear or branching forms.

In the lower inner quadrant of the left breast there is a persistent
cluster of masses, the largest mass measuring approximately 0.5 cm.

Ultrasound:

Targeted ultrasound is performed in the left breast at 8 o'clock 2
cm from the nipple demonstrating a benign cluster of anechoic with
the dominant portion measuring 0.5 x 0.2 x 0.4 cm. This corresponds
to the mammographic finding. No suspicious solid mass.
IMPRESSION: 1. Benign milk of calcium calcifications in the upper-outer quadrant
of the left breast.

2.  Benign cluster of cysts in the left breast at 8 o'clock.

RECOMMENDATION:
Screening mammogram in one year.(Code:[id number]-[id number])

I have discussed the findings and recommendations with the patient.
If applicable, a reminder letter will be sent to the patient
regarding the next appointment.

BI-RADS CATEGORY  2: Benign.

## 2022-04-05 ENCOUNTER — Ambulatory Visit (INDEPENDENT_AMBULATORY_CARE_PROVIDER_SITE_OTHER): Payer: Medicare PPO

## 2022-04-05 VITALS — Ht 66.0 in | Wt 140.0 lb

## 2022-04-05 DIAGNOSIS — Z0000 Encounter for general adult medical examination without abnormal findings: Secondary | ICD-10-CM | POA: Diagnosis not present

## 2022-04-05 NOTE — Progress Notes (Signed)
I connected with  Alison Blackwell on 04/05/22 by a audio enabled telemedicine application and verified that I am speaking with the correct person using two identifiers.  Patient Location: Home  Provider Location: Office/Clinic  I discussed the limitations of evaluation and management by telemedicine. The patient expressed understanding and agreed to proceed.  Subjective:   Alison Blackwell is a 82 y.o. female who presents for Medicare Annual (Subsequent) preventive examination.  Review of Systems    Cardiac Risk Factors include: advanced age (>34mn, >>71women);hypertension;sedentary lifestyle     Objective:    Today's Vitals   04/05/22 1341  Weight: 140 lb (63.5 kg)  Height: '5\' 6"'$  (1.676 m)   Body mass index is 22.6 kg/m.     04/05/2022    1:50 PM 08/16/2020    8:17 AM 01/06/2020   11:23 AM 03/17/2015    1:53 PM 11/24/2014    9:09 AM 11/24/2014    9:08 AM  Advanced Directives  Does Patient Have a Medical Advance Directive? No No Yes Yes Yes Yes  Type of AComptrollerLiving will Living will;Healthcare Power of AEmmetLiving will   Copy of HWalterboroin Chart?   No - copy requested     Would patient like information on creating a medical advance directive?  No - Patient declined        Current Medications (verified) Outpatient Encounter Medications as of 04/05/2022  Medication Sig   aspirin EC 81 MG tablet Take 1 tablet by mouth daily.   atorvastatin (LIPITOR) 10 MG tablet TAKE 1 TABLET BY MOUTH EVERY DAY   Multiple Vitamin (MULTI-VITAMINS) TABS Take 1 tablet by mouth daily.   SYNTHROID 50 MCG tablet ONCE DAILY BY MOUTH DO NOT SUBSTITUTE PLEASE   cyclobenzaprine (FLEXERIL) 5 MG tablet Take 0.5-1 tablets (2.5-5 mg total) by mouth at bedtime. (Patient not taking: Reported on 04/05/2022)   Omega 3-6-9 CAPS Take 1 capsule by mouth daily. (Patient not taking: Reported on 04/05/2022)   No  facility-administered encounter medications on file as of 04/05/2022.    Allergies (verified) Patient has no known allergies.   History: Past Medical History:  Diagnosis Date   Hyperlipidemia    Past Surgical History:  Procedure Laterality Date   CHOLECYSTECTOMY  2002   THYROIDECTOMY  1990   Family History  Problem Relation Age of Onset   Lung cancer Mother    Healthy Sister    Healthy Sister    Breast cancer Daughter [phone]  Breast cancer Maternal Aunt [phone]  Healthy Son    Healthy Daughter    Breast cancer Maternal Aunt    Social History   Socioeconomic History   Marital status: Divorced    Spouse name: Not on file   Number of children: 3   Years of education: College   Highest education level: Some college, no degree  Occupational History   Occupation: Retired  Tobacco Use   Smoking status: Former    Types: Cigarettes   Smokeless tobacco: Never   Tobacco comments:    quit > 40 years ago  VScientific laboratory technicianUse: Never used  Substance and Sexual Activity   Alcohol use: Not Currently   Drug use: No   Sexual activity: Not on file  Other Topics Concern   Not on file  Social History Narrative   Not on file   Social Determinants of HRadio broadcast assistant  Strain: Low Risk  (04/05/2022)   Overall Financial Resource Strain (CARDIA)    Difficulty of Paying Living Expenses: Not hard at all  Food Insecurity: No Food Insecurity (04/05/2022)   Hunger Vital Sign    Worried About Running Out of Food in the Last Year: Never true    Ran Out of Food in the Last Year: Never true  Transportation Needs: No Transportation Needs (04/05/2022)   PRAPARE - Hydrologist (Medical): No    Lack of Transportation (Non-Medical): No  Physical Activity: Inactive (04/05/2022)   Exercise Vital Sign    Days of Exercise per Week: 0 days    Minutes of Exercise per Session: 0 min  Stress: No Stress Concern Present (04/05/2022)   [place]    Feeling of Stress : Not at all  Social Connections: Socially Isolated (04/05/2022)   Social Connection and Isolation Panel [NHANES]    Frequency of Communication with Friends and Family: More than three times a week    Frequency of Social Gatherings with Friends and Family: More than three times a week    Attends Religious Services: Never    Marine scientist or Organizations: No    Attends Music therapist: Never    Marital Status: Divorced    Tobacco Counseling Counseling given: Not Answered Tobacco comments: quit > 40 years ago   Clinical Intake:  Pre-visit preparation completed: Yes  Pain : No/denies pain     BMI - recorded: 22.6 Nutritional Status: BMI of 19-24  Normal Nutritional Risks: None Diabetes: No  How often do you need to have someone help you when you read instructions, pamphlets, or other written materials from your doctor or pharmacy?: 1 - Never  Diabetic?yes  Interpreter Needed?: No  Comments: son there temporary Information entered by :: B.Marykathleen Russi,LPN   Activities of Daily Living    04/05/2022    1:51 PM 09/20/2021    8:08 AM  In your present state of health, do you have any difficulty performing the following activities:  Hearing? 0 0  Vision? 0 0  Difficulty concentrating or making decisions? 0 0  Walking or climbing stairs? 0 0  Dressing or bathing? 0 0  Doing errands, shopping? 0 0  Preparing Food and eating ? N   Using the Toilet? N   In the past six months, have you accidently leaked urine? N   Do you have problems with loss of bowel control? N   Managing your Medications? N   Managing your Finances? N   Housekeeping or managing your Housekeeping? N     Patient Care Team: Eulis Foster, MD as PCP - General (Family Medicine) Mor[place], Lourdes Sledge, MD as Attending Physician (Endocrinology) [employer] [facility])  Indicate any recent  Medical Services you may have received from other than [facility] providers in the past year (date may be approximate).     Assessment:   This is a routine wellness examination for Alison Blackwell.  Hearing/Vision screen Hearing Screening - Comments:: Adequate hearing Vision Screening - Comments:: Adequate vision w/glasses Cataract surgery-[place]  Dietary issues and exercise activities discussed: Current Exercise Habits: The patient does not participate in regular exercise at present, Exercise limited by: orthopedic condition(s)   Goals Addressed             This Visit's Progress    DIET - INCREASE WATER INTAKE   On track    Recommend to drink  at least 6-8 8oz glasses of water per day.       Depression Screen    04/05/2022    1:48 PM 09/20/2021    8:08 AM 03/23/2021   11:25 AM 12/25/2019    9:10 AM 12/07/2018    8:22 AM 11/27/2017    9:03 AM 11/25/2016    9:10 AM  PHQ 2/9 Scores  PHQ - 2 Score 0 0 0 0 0 0 0  PHQ- 9 Score  1 0    0    Fall Risk    04/05/2022    1:45 PM 09/20/2021    8:08 AM 03/23/2021   11:24 AM 01/06/2020   11:24 AM 12/25/2019    9:10 AM  Fall Risk   Falls in the past year? 0 0 0 0 0  Number falls in past yr: 0 0  0 0  Injury with Fall? 0 0  0 0  Risk for fall due to : No Fall Risks No Fall Risks     Follow up Education provided;Falls prevention discussed Falls evaluation completed   Falls evaluation completed    FALL RISK PREVENTION PERTAINING TO THE HOME:  Any stairs in or around the home? Yes  If so, are there any without handrails? Yes  Home free of loose throw rugs in walkways, pet beds, electrical cords, etc? Yes  Adequate lighting in your home to reduce risk of falls? Yes   ASSISTIVE DEVICES UTILIZED TO PREVENT FALLS:  Life alert? No  Use of a cane, walker or w/c? No  Grab bars in the bathroom? No  Shower chair or bench in shower? No  Elevated toilet seat or a handicapped toilet? No     Cognitive Function:        04/05/2022    1:55 PM  12/25/2019    9:13 AM 12/07/2018    8:26 AM  6CIT Screen  What Year? 0 points 0 points 0 points  What month? 0 points 0 points 0 points  What time? 0 points 0 points 0 points  Count back from 20 0 points 0 points 0 points  Months in reverse 0 points 0 points 0 points  Repeat phrase 0 points 2 points 2 points  Total Score 0 points 2 points 2 points    Immunizations Immunization History  Administered Date(s) Administered   Influenza, High Dose Seasonal PF 11/08/2014, 11/06/2015, 11/09/2016, 10/28/2017   Influenza-Unspecified 10/22/2018, 10/28/2019   PFIZER(Purple Top)SARS-COV-2 Vaccination 02/27/2019, 03/20/2019, 10/29/2019   Pfizer Covid-19 Vaccine Bivalent Booster 64yr & up 10/08/2020   Pneumococcal Conjugate-13 11/02/2013   Pneumococcal Polysaccharide-23 11/17/2007   Td 03/19/1996, 05/14/2008   Tdap 08/31/2021   Zoster Recombinat (Shingrix) 01/23/2018, 07/02/2018, 08/31/2021   Zoster, Live 11/22/2007    TDAP status: Up to date  Flu Vaccine status: Up to date  Pneumococcal vaccine status: Up to date  Covid-19 vaccine status: Completed vaccines  Qualifies for Shingles Vaccine? Yes   Zostavax completed Yes   Shingrix Completed?: Yes  Screening Tests Health Maintenance  Topic Date Due   COVID-19 Vaccine (5 - 2023-24 season) 09/24/2021   MAMMOGRAM  08/20/2022   Medicare Annual Wellness (AWV)  04/05/2023   DEXA SCAN  02/05/2025   DTaP/Tdap/Td (4 - Td or Tdap) 09/01/2031   Pneumonia Vaccine 82 Years old  Completed   INFLUENZA VACCINE  Completed   Zoster Vaccines- Shingrix  Completed   HPV VACCINES  Aged Out    Health Maintenance  Health Maintenance Due  Topic Date Due  COVID-19 Vaccine (5 - 2023-24 season) 09/24/2021    Colorectal cancer screening: No longer required.   Mammogram status: Completed yes. Repeat every year  Bone Density status: Completed yes. Results reflect: Bone density results: OSTEOPENIA. Repeat every 5 years.  Lung Cancer Screening:  (Low Dose CT Chest recommended if Age 68-80 years, 30 pack-year currently smoking OR have quit w/in 15years.) does not qualify.   Lung Cancer Screening Referral: no  Additional Screening:  Hepatitis C Screening: does not qualify; Completed yes  Vision Screening: Recommended annual ophthalmology exams for early detection of glaucoma and other disorders of the eye. Is the patient up to date with their annual eye exam?  Yes  Who is the provider or what is the name of the office in which the patient attends annual eye exams? [place] If pt is not established with a provider, would they like to be referred to a provider to establish care? No .   Dental Screening: Recommended annual dental exams for proper oral hygiene  Community Resource Referral / Chronic Care Management: CRR required this visit?  No   CCM required this visit?  No      Plan:     I have personally reviewed and noted the following in the patient's chart:   Medical and social history Use of alcohol, tobacco or illicit drugs  Current medications and supplements including opioid prescriptions. Patient is not currently taking opioid prescriptions. Functional ability and status Nutritional status Physical activity Advanced directives List of other physicians Hospitalizations, surgeries, and ER visits in previous 12 months Vitals Screenings to include cognitive, depression, and falls Referrals and appointments  In addition, I have reviewed and discussed with patient certain preventive protocols, quality metrics, and best practice recommendations. A written personalized care plan for preventive services as well as general preventive health recommendations were provided to patient.     Roger Shelter, LPN   [id number]   Nurse Notes: pt is doing well;she has no concerns or questions at this time.

## 2022-04-05 NOTE — Patient Instructions (Signed)
Alison Blackwell , Thank you for taking time to come for your Medicare Wellness Visit. I appreciate your ongoing commitment to your health goals. Please review the following plan we discussed and let me know if I can assist you in the future.   These are the goals we discussed:  Goals      DIET - INCREASE WATER INTAKE     Recommend to drink at least 6-8 8oz glasses of water per day.        This is a list of the screening recommended for you and due dates:  Health Maintenance  Topic Date Due   COVID-19 Vaccine (5 - 2023-24 season) 09/24/2021   Mammogram  08/20/2022   Medicare Annual Wellness Visit  04/05/2023   DEXA scan (bone density measurement)  02/05/2025   DTaP/Tdap/Td vaccine (4 - Td or Tdap) 09/01/2031   Pneumonia Vaccine  Completed   Flu Shot  Completed   Zoster (Shingles) Vaccine  Completed   HPV Vaccine  Aged Out    Advanced directives: no  Conditions/risks identified: none  Next appointment: Follow up in one year for your annual wellness visit 04/10/2023 '@2pm'$  telephone   Preventive Care 65 Years and Older, Female Preventive care refers to lifestyle choices and visits with your health care provider that can promote health and wellness. What does preventive care include? A yearly physical exam. This is also called an annual well check. Dental exams once or twice a year. Routine eye exams. Ask your health care provider how often you should have your eyes checked. Personal lifestyle choices, including: Daily care of your teeth and gums. Regular physical activity. Eating a healthy diet. Avoiding tobacco and drug use. Limiting alcohol use. Practicing safe sex. Taking low-dose aspirin every day. Taking vitamin and mineral supplements as recommended by your health care provider. What happens during an annual well check? The services and screenings done by your health care provider during your annual well check will depend on your age, overall health, lifestyle risk  factors, and family history of disease. Counseling  Your health care provider may ask you questions about your: Alcohol use. Tobacco use. Drug use. Emotional well-being. Home and relationship well-being. Sexual activity. Eating habits. History of falls. Memory and ability to understand (cognition). Work and work Statistician. Reproductive health. Screening  You may have the following tests or measurements: Height, weight, and BMI. Blood pressure. Lipid and cholesterol levels. These may be checked every 5 years, or more frequently if you are over 18 years old. Skin check. Lung cancer screening. You may have this screening every year starting at age 61 if you have a 30-pack-year history of smoking and currently smoke or have quit within the past 15 years. Fecal occult blood test (FOBT) of the stool. You may have this test every year starting at age 55. Flexible sigmoidoscopy or colonoscopy. You may have a sigmoidoscopy every 5 years or a colonoscopy every 10 years starting at age 70. Hepatitis C blood test. Hepatitis B blood test. Sexually transmitted disease (STD) testing. Diabetes screening. This is done by checking your blood sugar (glucose) after you have not eaten for a while (fasting). You may have this done every 1-3 years. Bone density scan. This is done to screen for osteoporosis. You may have this done starting at age 52. Mammogram. This may be done every 1-2 years. Talk to your health care provider about how often you should have regular mammograms. Talk with your health care provider about your test results, treatment  options, and if necessary, the need for more tests. Vaccines  Your health care provider may recommend certain vaccines, such as: Influenza vaccine. This is recommended every year. Tetanus, diphtheria, and acellular pertussis (Tdap, Td) vaccine. You may need a Td booster every 10 years. Zoster vaccine. You may need this after age 15. Pneumococcal 13-valent  conjugate (PCV13) vaccine. One dose is recommended after age 17. Pneumococcal polysaccharide (PPSV23) vaccine. One dose is recommended after age 84. Talk to your health care provider about which screenings and vaccines you need and how often you need them. This information is not intended to replace advice given to you by your health care provider. Make sure you discuss any questions you have with your health care provider. Document Released: 02/06/2015 Document Revised: 09/30/2015 Document Reviewed: 11/11/2014 Elsevier Interactive Patient Education  2017 [place] Prevention in the Home Falls can cause injuries. They can happen to people of all ages. There are many things you can do to make your home safe and to help prevent falls. What can I do on the outside of my home? Regularly fix the edges of walkways and driveways and fix any cracks. Remove anything that might make you trip as you walk through a door, such as a raised step or threshold. Trim any bushes or trees on the path to your home. Use bright outdoor lighting. Clear any walking paths of anything that might make someone trip, such as rocks or tools. Regularly check to see if handrails are loose or broken. Make sure that both sides of any steps have handrails. Any raised decks and porches should have guardrails on the edges. Have any leaves, snow, or ice cleared regularly. Use sand or salt on walking paths during winter. Clean up any spills in your garage right away. This includes oil or grease spills. What can I do in the bathroom? Use night lights. Install grab bars by the toilet and in the tub and shower. Do not use towel bars as grab bars. Use non-skid mats or decals in the tub or shower. If you need to sit down in the shower, use a plastic, non-slip stool. Keep the floor dry. Clean up any water that spills on the floor as soon as it happens. Remove soap buildup in the tub or shower regularly. Attach bath mats  securely with double-sided non-slip rug tape. Do not have throw rugs and other things on the floor that can make you trip. What can I do in the bedroom? Use night lights. Make sure that you have a light by your bed that is easy to reach. Do not use any sheets or blankets that are too big for your bed. They should not hang down onto the floor. Have a firm chair that has side arms. You can use this for support while you get dressed. Do not have throw rugs and other things on the floor that can make you trip. What can I do in the kitchen? Clean up any spills right away. Avoid walking on wet floors. Keep items that you use a lot in easy-to-reach places. If you need to reach something above you, use a strong step stool that has a grab bar. Keep electrical cords out of the way. Do not use floor polish or wax that makes floors slippery. If you must use wax, use non-skid floor wax. Do not have throw rugs and other things on the floor that can make you trip. What can I do with my stairs? Do not  leave any items on the stairs. Make sure that there are handrails on both sides of the stairs and use them. Fix handrails that are broken or loose. Make sure that handrails are as long as the stairways. Check any carpeting to make sure that it is firmly attached to the stairs. Fix any carpet that is loose or worn. Avoid having throw rugs at the top or bottom of the stairs. If you do have throw rugs, attach them to the floor with carpet tape. Make sure that you have a light switch at the top of the stairs and the bottom of the stairs. If you do not have them, ask someone to add them for you. What else can I do to help prevent falls? Wear shoes that: Do not have high heels. Have rubber bottoms. Are comfortable and fit you well. Are closed at the toe. Do not wear sandals. If you use a stepladder: Make sure that it is fully opened. Do not climb a closed stepladder. Make sure that both sides of the stepladder  are locked into place. Ask someone to hold it for you, if possible. Clearly mark and make sure that you can see: Any grab bars or handrails. First and last steps. Where the edge of each step is. Use tools that help you move around (mobility aids) if they are needed. These include: Canes. Walkers. Scooters. Crutches. Turn on the lights when you go into a dark area. Replace any light bulbs as soon as they burn out. Set up your furniture so you have a clear path. Avoid moving your furniture around. If any of your floors are uneven, fix them. If there are any pets around you, be aware of where they are. Review your medicines with your doctor. Some medicines can make you feel dizzy. This can increase your chance of falling. Ask your doctor what other things that you can do to help prevent falls. This information is not intended to replace advice given to you by your health care provider. Make sure you discuss any questions you have with your health care provider. Document Released: 11/06/2008 Document Revised: 06/18/2015 Document Reviewed: 02/14/2014 Elsevier Interactive Patient Education  2017 [employer].

## 2022-04-16 ENCOUNTER — Other Ambulatory Visit: Payer: Self-pay | Admitting: Family Medicine

## 2022-04-16 DIAGNOSIS — E7800 Pure hypercholesterolemia, unspecified: Secondary | ICD-10-CM

## 2022-08-09 ENCOUNTER — Other Ambulatory Visit: Payer: Self-pay | Admitting: Family Medicine

## 2022-08-09 DIAGNOSIS — Z1231 Encounter for screening mammogram for malignant neoplasm of breast: Secondary | ICD-10-CM

## 2022-08-24 ENCOUNTER — Ambulatory Visit
Admission: RE | Admit: 2022-08-24 | Discharge: 2022-08-24 | Disposition: A | Discharge status: Home / Self Care | Payer: Medicare PPO | Source: Ambulatory Visit | Attending: Family Medicine | Admitting: Family Medicine

## 2022-08-24 DIAGNOSIS — Z1231 Encounter for screening mammogram for malignant neoplasm of breast: Secondary | ICD-10-CM | POA: Insufficient documentation

## 2023-04-11 ENCOUNTER — Other Ambulatory Visit: Payer: Self-pay | Admitting: Family Medicine

## 2023-04-11 DIAGNOSIS — E7800 Pure hypercholesterolemia, unspecified: Secondary | ICD-10-CM

## 2023-04-23 IMAGING — US US PELVIS COMPLETE WITH TRANSVAGINAL
1 series · 14 of 25 positions shown · non-contrast
Comparison: None

CLINICAL DATA: Vaginal bleeding.

EXAM:
TRANSABDOMINAL AND TRANSVAGINAL ULTRASOUND OF PELVIS
TECHNIQUE: Both transabdominal and transvaginal ultrasound examinations of the
pelvis were performed. Transabdominal technique was performed for
global imaging of the pelvis including uterus, ovaries, adnexal
regions, and pelvic cul-de-sac. It was necessary to proceed with
endovaginal exam following the transabdominal exam to visualize the
endometrium and bilateral ovaries.

[Series 1: us pelvis (transabdominal only) · 83 acquisitions, 14 frames shown]
[im 1/83]
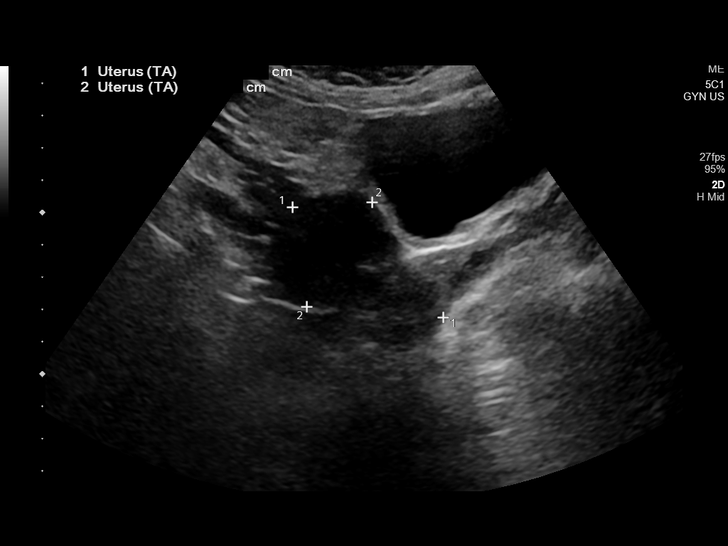
[im 7/83]
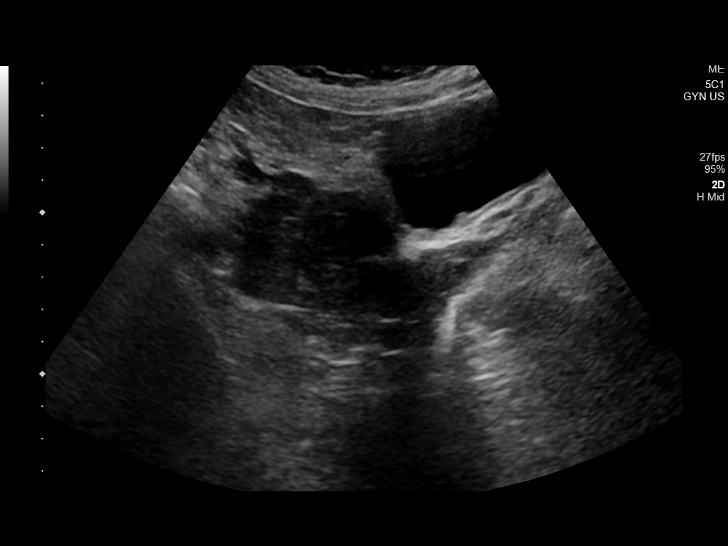
[im 14/83]
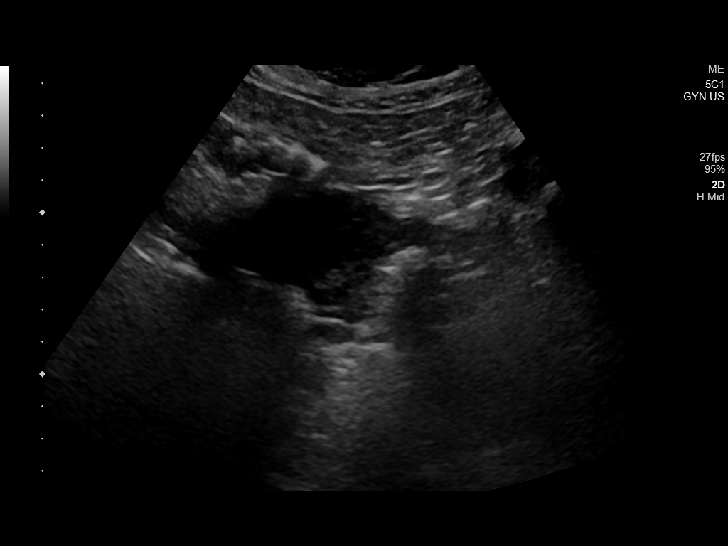
[im 21/83]
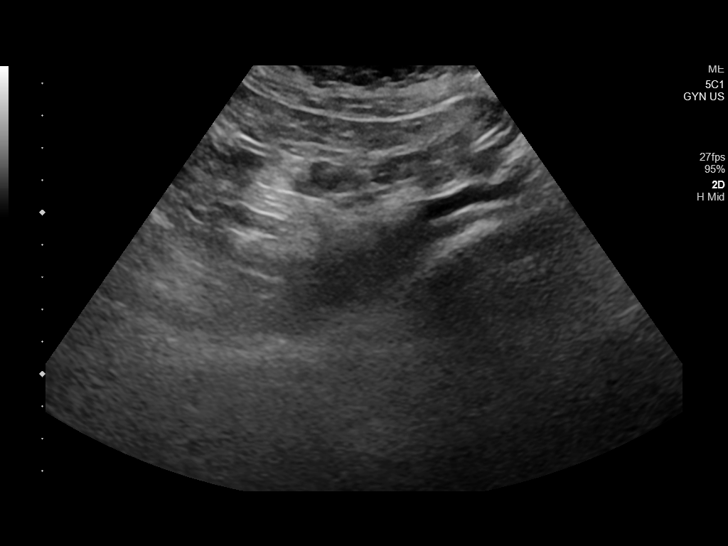
[im 28/83]
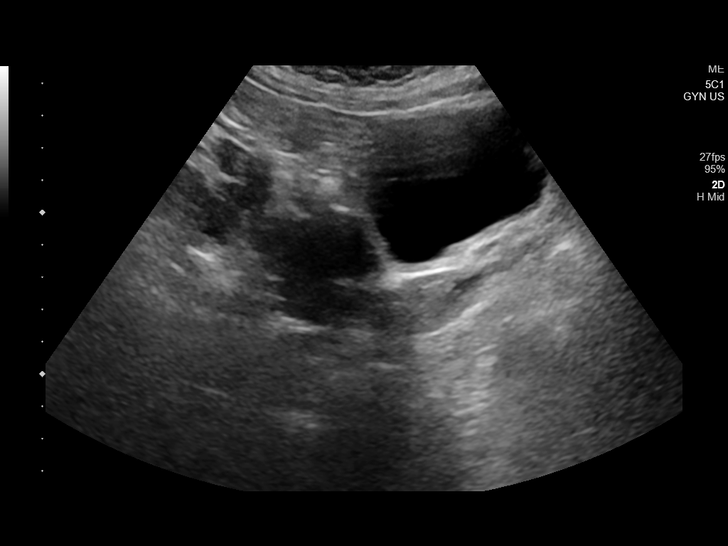
[im 31/83]
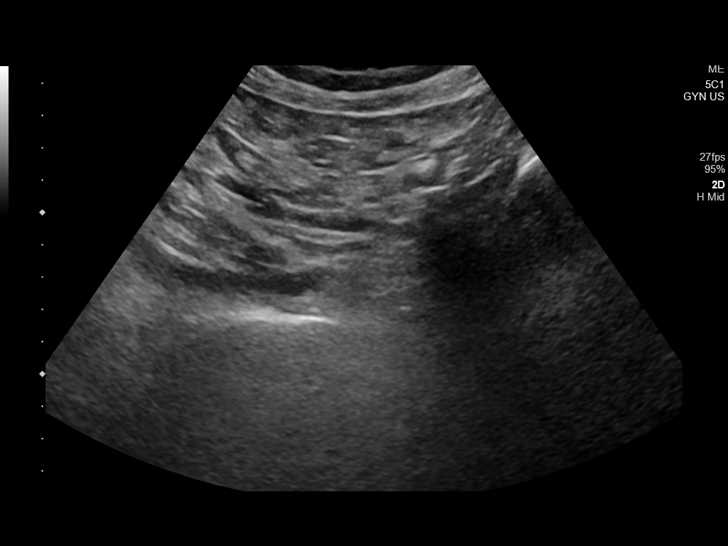
[im 38/83]
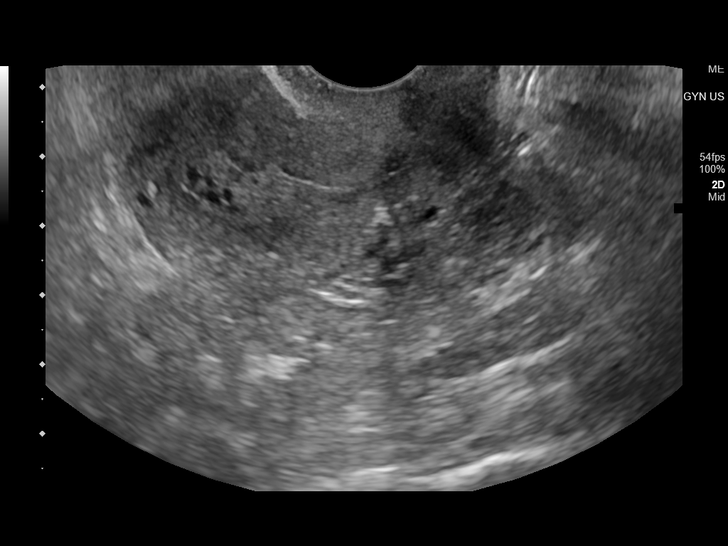
[im 45/83]
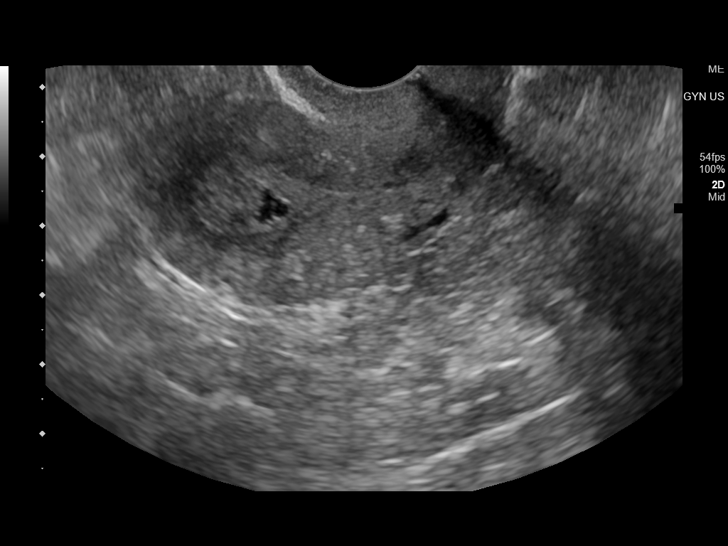
[im 52/83]
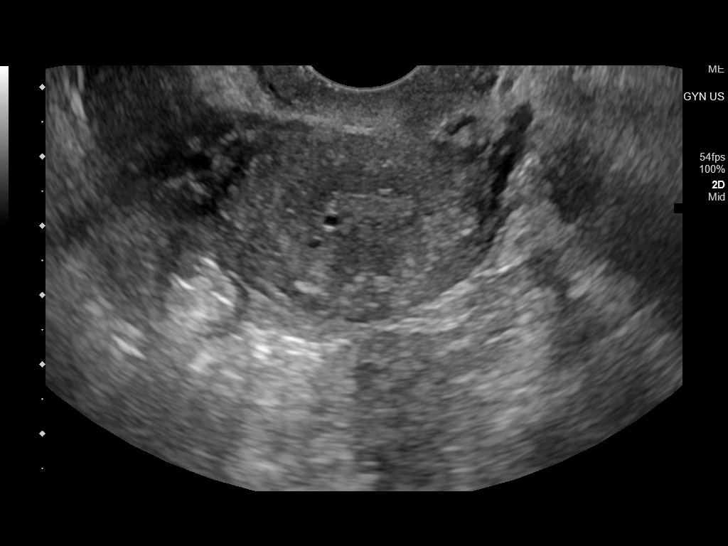
[im 55/83]
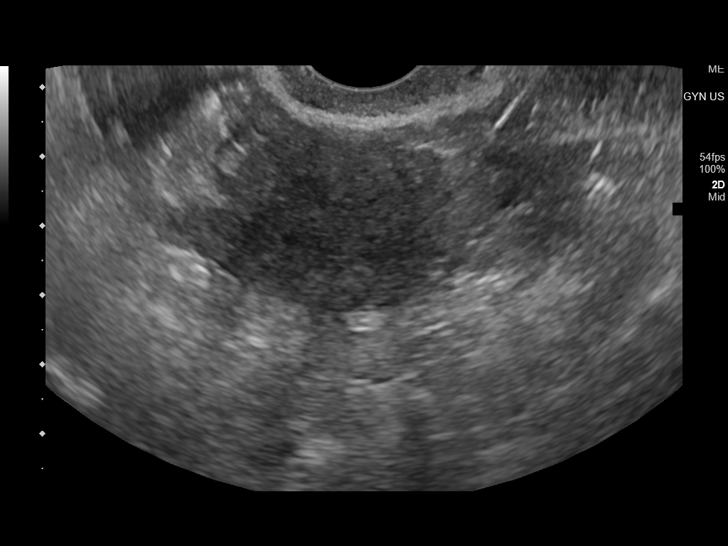
[im 62/83]
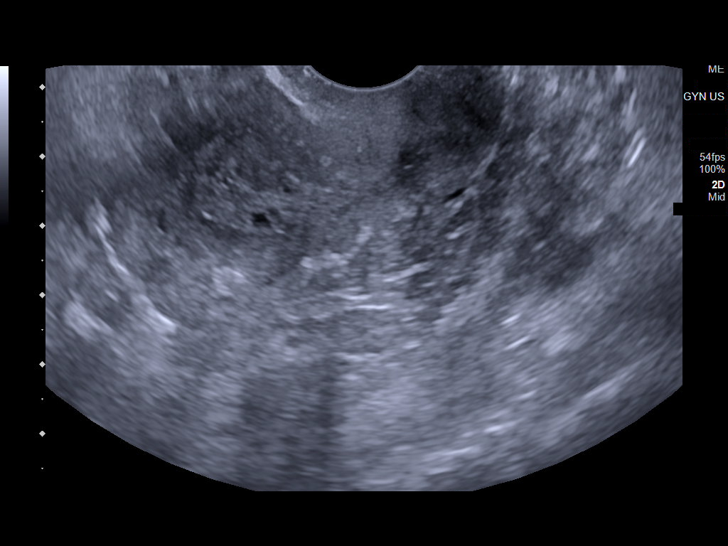
[im 69/83]
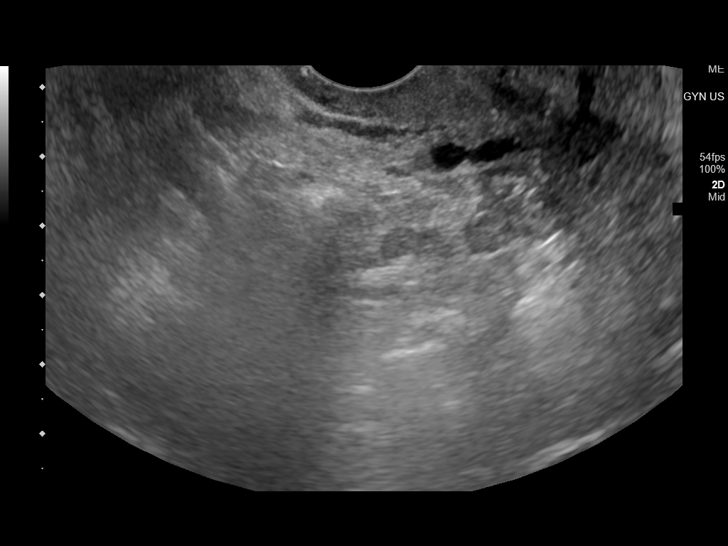
[im 76/83]
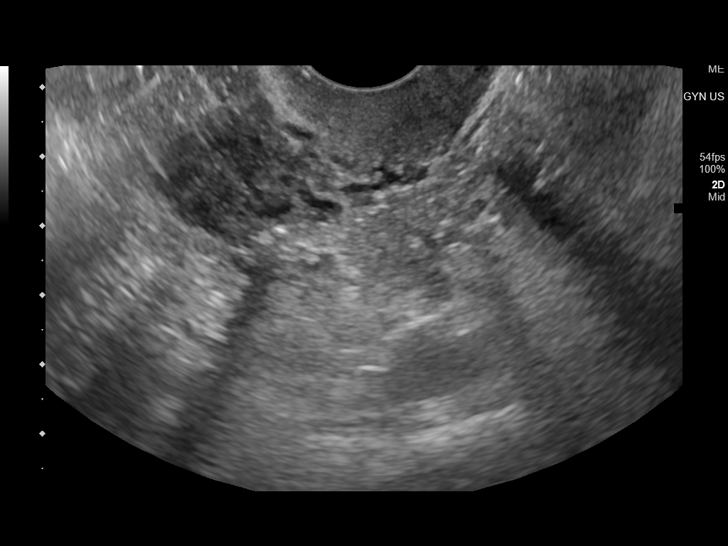
[im 83/83]
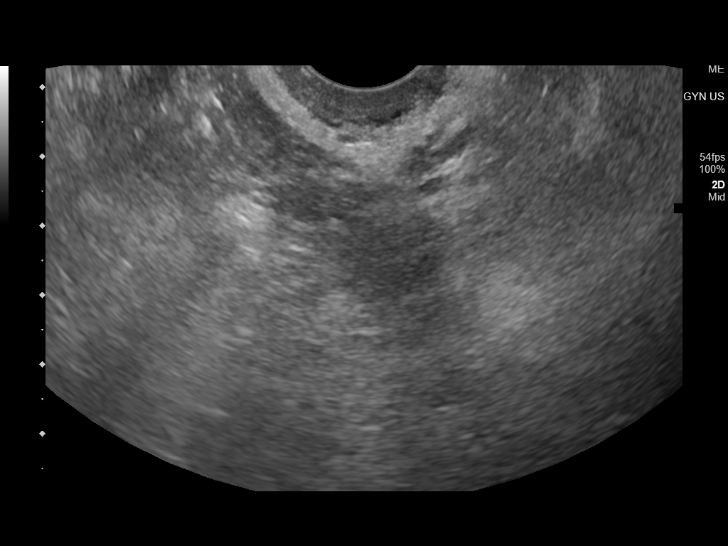

[14 of 25 positions shown; findings below may reference images not displayed]

FINDINGS: Uterus

Measurements: 4.7 cm x 3.3 cm x 4.5 cm = volume: 36.2 mL. No
fibroids or other mass visualized.

Endometrium

Thickness: 10.1 mm. The endometrium is diffusely heterogeneous in
appearance. No abnormal flow is seen within this region on color
Doppler evaluation.

Right ovary

The right ovary is not visualized.

Left ovary

The left ovary is not visualized.

Other findings

No abnormal free fluid.
IMPRESSION: 1. Thickened, heterogeneous endometrium. A small amount of blood
products within the endometrial canal cannot be excluded.

## 2023-04-26 ENCOUNTER — Ambulatory Visit: Payer: Self-pay | Admitting: Emergency Medicine

## 2023-04-26 VITALS — Ht 66.0 in | Wt 145.0 lb

## 2023-04-26 DIAGNOSIS — Z0000 Encounter for general adult medical examination without abnormal findings: Secondary | ICD-10-CM | POA: Diagnosis not present

## 2023-04-26 DIAGNOSIS — Z1231 Encounter for screening mammogram for malignant neoplasm of breast: Secondary | ICD-10-CM

## 2023-04-26 NOTE — Progress Notes (Signed)
 Subjective:   Alison Blackwell is a 83 y.o. who presents for a Medicare Wellness preventive visit.  Visit Complete: Virtual I connected with  [name] on 04/26/23 by a audio enabled telemedicine application and verified that I am speaking with the correct person using two identifiers.  Patient Location: Home  Provider Location: Home Office  I discussed the limitations of evaluation and management by telemedicine. The patient expressed understanding and agreed to proceed.  Vital Signs: Because this visit was a virtual/telehealth visit, some criteria may be missing or patient reported. Any vitals not documented were not able to be obtained and vitals that have been documented are patient reported.  VideoDeclined- This patient declined Librarian, academic. Therefore the visit was completed with audio only.  Persons Participating in Visit: Patient.  AWV Questionnaire: No: Patient Medicare AWV questionnaire was not completed prior to this visit.  Cardiac Risk Factors include: advanced age (>73men, >48 women);dyslipidemia;hypertension     Objective:    Today's Vitals   04/26/23 1428  Weight: 145 lb (65.8 kg)  Height: 5\' 6"  (1.676 m)   Body mass index is 23.4 kg/m.     04/26/2023    2:47 PM 04/05/2022    1:50 PM 08/16/2020    8:17 AM 01/06/2020   11:23 AM 03/17/2015    1:53 PM 11/24/2014    9:09 AM 11/24/2014    9:08 AM  Advanced Directives  Does Patient Have a Medical Advance Directive? No No No Yes Yes Yes Yes  Type of Theme park manager;Living will Living will;Healthcare Power of [employer] Power of [place];Living will   Copy of Healthcare Power of Attorney in Chart?    No - copy requested     Would patient like information on creating a medical advance directive? Yes (MAU/Ambulatory/Procedural Areas - Information given)  No - Patient declined        Current Medications (verified) Outpatient  Encounter Medications as of 04/26/2023  Medication Sig   atorvastatin (LIPITOR) 10 MG tablet TAKE 1 TABLET BY MOUTH EVERY DAY   Multiple Vitamin (MULTI-VITAMINS) TABS Take 1 tablet by mouth daily.   aspirin EC 81 MG tablet Take 1 tablet by mouth daily. (Patient not taking: Reported on 04/26/2023)   cyclobenzaprine (FLEXERIL) 5 MG tablet Take 0.5-1 tablets (2.5-5 mg total) by mouth at bedtime. (Patient not taking: Reported on 04/26/2023)   Omega 3-6-9 CAPS Take 1 capsule by mouth daily. (Patient not taking: Reported on 04/26/2023)   SYNTHROID 50 MCG tablet ONCE DAILY BY MOUTH DO NOT SUBSTITUTE PLEASE (Patient not taking: Reported on 04/26/2023)   No facility-administered encounter medications on file as of 04/26/2023.    Allergies (verified) Patient has no known allergies.   History: Past Medical History:  Diagnosis Date   Hyperlipidemia    Past Surgical History:  Procedure Laterality Date   CHOLECYSTECTOMY  2002   THYROIDECTOMY  1990   Family History  Problem Relation Age of Onset   Lung cancer Mother    Other Father        unknown medical history   Healthy Sister    Healthy Sister    Breast cancer Daughter 52   Healthy Daughter    Healthy Son    Breast cancer Maternal Aunt 56   Breast cancer Maternal Aunt    Social History   Socioeconomic History   Marital status: Divorced    Spouse name: Not on file   Number of  children: 3   Years of education: College   Highest education level: Some college, no degree  Occupational History   Occupation: Retired  Tobacco Use   Smoking status: Former    Current packs/day: 0.00    Average packs/day: 0.1 packs/day for 1 year (0.1 ttl pk-yrs)    Types: Cigarettes    Start date: 1964    Quit date: 1965    Years since quitting: 60.2   Smokeless tobacco: Never   Tobacco comments:    quit > 40 years ago    04/26/23 only smoked for 6 months and not an everyday smoker  Vaping Use   Vaping status: Never Used  Substance and Sexual Activity    Alcohol use: Not Currently   Drug use: No   Sexual activity: Not on file  Other Topics Concern   Not on file  Social History Narrative   Not on file   Social Drivers of Health   Financial Resource Strain: Low Risk  (04/26/2023)   Overall Financial Resource Strain (CARDIA)    Difficulty of Paying Living Expenses: Not hard at all  Food Insecurity: No Food Insecurity (04/26/2023)   Hunger Vital Sign    Worried About Running Out of Food in the Last Year: Never true    Ran Out of Food in the Last Year: Never true  Transportation Needs: No Transportation Needs (04/26/2023)   PRAPARE - Administrator, Civil Service (Medical): No    Lack of Transportation (Non-Medical): No  Physical Activity: Inactive (04/26/2023)   Exercise Vital Sign    Days of Exercise per Week: 0 days    Minutes of Exercise per Session: 0 min  Stress: No Stress Concern Present (04/26/2023)   [employer] of Occupational Health - Occupational Stress Questionnaire    Feeling of Stress : Not at all  Social Connections: Moderately Isolated (04/26/2023)   Social Connection and Isolation Panel [NHANES]    Frequency of Communication with Friends and Family: More than three times a week    Frequency of Social Gatherings with Friends and Family: More than three times a week    Attends Religious Services: More than 4 times per year    Active Member of [employer] or Organizations: No    Attends Banker Meetings: Never    Marital Status: Divorced    Tobacco Counseling Counseling given: Not Answered Tobacco comments: quit > 40 years ago 04/26/23 only smoked for 6 months and not an everyday smoker    Clinical Intake:  Pre-visit preparation completed: Yes  Pain : No/denies pain     BMI - recorded: 23.4 Nutritional Status: BMI of 19-24  Normal Nutritional Risks: None Diabetes: No  Lab Results  Component Value Date   HGBA1C 6.0 (H) 12/25/2019   HGBA1C 5.9 (H) 12/07/2018   HGBA1C 5.9 (H) 11/27/2017      How often do you need to have someone help you when you read instructions, pamphlets, or other written materials from your doctor or pharmacy?: 1 - Never  Interpreter Needed?: No  Information entered by :: Tora Kindred, CMA   Activities of Daily Living     04/26/2023    2:31 PM  In your present state of health, do you have any difficulty performing the following activities:  Hearing? 0  Vision? 0  Difficulty concentrating or making decisions? 0  Walking or climbing stairs? 0  Dressing or bathing? 0  Doing errands, shopping? 0  Preparing Food and eating ? N  Using the Toilet? N  In the past six months, have you accidently leaked urine? N  Do you have problems with loss of bowel control? N  Managing your Medications? N  Managing your Finances? N  Housekeeping or managing your Housekeeping? N    Patient Care Team: Ronnald Ramp, MD as PCP - General (Family Medicine) Thereasa Solo (Optometry)  Indicate any recent Medical Services you may have received from other than [facility] providers in the past year (date may be approximate).     Assessment:   This is a routine wellness examination for Kaytelyn.  Hearing/Vision screen Hearing Screening - Comments:: Denies hearing loss Vision Screening - Comments:: Gets eye exams, Dr. Thereasa Solo [place]   Goals Addressed             This Visit's Progress    Patient Stated       Eat healthier       Depression Screen    04/26/2023    2:44 PM 04/05/2022    1:48 PM 09/20/2021    8:08 AM 03/23/2021   11:25 AM 12/25/2019    9:10 AM 12/07/2018    8:22 AM 11/27/2017    9:03 AM  PHQ 2/9 Scores  PHQ - 2 Score 0 0 0 0 0 0 0  PHQ- 9 Score 0  1 0       Fall Risk     04/26/2023    2:49 PM 04/05/2022    1:45 PM 09/20/2021    8:08 AM 03/23/2021   11:24 AM 01/06/2020   11:24 AM  Fall Risk   Falls in the past year? 0 0 0 0 0  Number falls in past yr: 0 0 0  0  Injury with Fall? 0 0 0  0  Risk for fall  due to : No Fall Risks No Fall Risks No Fall Risks    Follow up Falls prevention discussed;Falls evaluation completed Education provided;Falls prevention discussed Falls evaluation completed      MEDICARE RISK AT HOME:  Medicare Risk at Home Any stairs in or around the home?: Yes If so, are there any without handrails?: No Home free of loose throw rugs in walkways, pet beds, electrical cords, etc?: Yes Adequate lighting in your home to reduce risk of falls?: Yes Life alert?: No Use of a cane, walker or w/c?: No Grab bars in the bathroom?: No Shower chair or bench in shower?: No Elevated toilet seat or a handicapped toilet?: No  TIMED UP AND GO:  Was the test performed?  No  Cognitive Function: 6CIT completed        04/26/2023    2:50 PM 04/05/2022    1:55 PM 12/25/2019    9:13 AM 12/07/2018    8:26 AM  6CIT Screen  What Year? 0 points 0 points 0 points 0 points  What month? 3 points 0 points 0 points 0 points  What time? 0 points 0 points 0 points 0 points  Count back from 20 0 points 0 points 0 points 0 points  Months in reverse 0 points 0 points 0 points 0 points  Repeat phrase 0 points 0 points 2 points 2 points  Total Score 3 points 0 points 2 points 2 points    Immunizations Immunization History  Administered Date(s) Administered   Influenza, High Dose Seasonal PF 11/08/2014, 11/06/2015, 11/09/2016, 10/28/2017   Influenza-Unspecified 10/22/2018, 10/28/2019   PFIZER(Purple Top)SARS-COV-2 Vaccination 02/27/2019, 03/20/2019, 10/29/2019   Pfizer Covid-19 Vaccine Bivalent Booster 68yrs &  up 10/08/2020   Pneumococcal Conjugate-13 11/02/2013   Pneumococcal Polysaccharide-23 11/17/2007   Td 03/19/1996, 05/14/2008   Tdap 08/31/2021   Unspecified SARS-COV-2 Vaccination 10/06/2022   Zoster Recombinant(Shingrix) 01/23/2018, 07/02/2018, 08/31/2021   Zoster, Live 11/22/2007    Screening Tests Health Maintenance  Topic Date Due   COVID-19 Vaccine (6 - 2024-25 season)  04/05/2023   MAMMOGRAM  08/24/2023   INFLUENZA VACCINE  08/25/2023   Medicare Annual Wellness (AWV)  04/25/2024   DEXA SCAN  02/05/2025   DTaP/Tdap/Td (4 - Td or Tdap) 09/01/2031   Pneumonia Vaccine 3+ Years old  Completed   Zoster Vaccines- Shingrix  Completed   HPV VACCINES  Aged Out   Hepatitis C Screening  Discontinued    Health Maintenance  Health Maintenance Due  Topic Date Due   COVID-19 Vaccine (6 - 2024-25 season) 04/05/2023   Health Maintenance Items Addressed: Mammogram ordered, See Nurse Notes  Additional Screening:  Vision Screening: Recommended annual ophthalmology exams for early detection of glaucoma and other disorders of the eye.  Dental Screening: Recommended annual dental exams for proper oral hygiene  Community Resource Referral / Chronic Care Management: CRR required this visit?  No   CCM required this visit?  No     Plan:     I have personally reviewed and noted the following in the patient's chart:   Medical and social history Use of alcohol, tobacco or illicit drugs  Current medications and supplements including opioid prescriptions. Patient is not currently taking opioid prescriptions. Functional ability and status Nutritional status Physical activity Advanced directives List of other physicians Hospitalizations, surgeries, and ER visits in previous 12 months Vitals Screenings to include cognitive, depression, and falls Referrals and appointments  In addition, I have reviewed and discussed with patient certain preventive protocols, quality metrics, and best practice recommendations. A written personalized care plan for preventive services as well as general preventive health recommendations were provided to patient.     Tora Kindred, CMA   04/26/2023   After Visit Summary: (MyChart) Due to this being a telephonic visit, the after visit summary with patients personalized plan was offered to patient via MyChart   Notes:  6 CIT Score  - 3 Placed order for MMG (due ~08/24/23) Made OV for 06/01/23 (last seen 09/20/21 with Dr. Sullivan Lone)

## 2023-04-26 NOTE — Patient Instructions (Signed)
 Alison Blackwell , Thank you for taking time to come for your Medicare Wellness Visit. I appreciate your ongoing commitment to your health goals. Please review the following plan we discussed and let me know if I can assist you in the future.   Referrals/Orders/Follow-Ups/Clinician Recommendations: I have placed an order for a mammogram (due 08/25/23). Call [facility] @ [phone] to schedule at your convenience. I have made you an appointment with Dr. Neita Garnet who is your new PCP for 06/01/23 @ 8:40am. It is very important that you keep this appointment.  This is a list of the screening recommended for you and due dates:  Health Maintenance  Topic Date Due   COVID-19 Vaccine (6 - 2024-25 season) 04/05/2023   Mammogram  08/24/2023   Flu Shot  08/25/2023   Medicare Annual Wellness Visit  04/25/2024   DEXA scan (bone density measurement)  02/05/2025   DTaP/Tdap/Td vaccine (4 - Td or Tdap) 09/01/2031   Pneumonia Vaccine  Completed   Zoster (Shingles) Vaccine  Completed   HPV Vaccine  Aged Out   Hepatitis C Screening  Discontinued    Advanced directives: (ACP Link)Information on Advanced Care Planning can be found at [facility] Advance Health Care Directives Advance Health Care Directives. [email or web address] You may also get these forms at your doctor's office.  Once you have completed the forms, please bring a copy of your health care power of attorney and living will to the office to be added to your chart at your convenience.   Next Medicare Annual Wellness Visit scheduled for next year: Yes, 05/01/24 @ 2:30pm (phone visit)

## 2023-06-01 ENCOUNTER — Ambulatory Visit: Admitting: Family Medicine

## 2023-06-01 ENCOUNTER — Encounter: Payer: Self-pay | Admitting: Family Medicine

## 2023-06-01 VITALS — BP 149/69 | HR 85 | Resp 16 | Ht 66.0 in | Wt 151.2 lb

## 2023-06-01 DIAGNOSIS — K219 Gastro-esophageal reflux disease without esophagitis: Secondary | ICD-10-CM | POA: Diagnosis not present

## 2023-06-01 DIAGNOSIS — J301 Allergic rhinitis due to pollen: Secondary | ICD-10-CM | POA: Diagnosis not present

## 2023-06-01 DIAGNOSIS — M8589 Other specified disorders of bone density and structure, multiple sites: Secondary | ICD-10-CM

## 2023-06-01 DIAGNOSIS — E890 Postprocedural hypothyroidism: Secondary | ICD-10-CM | POA: Diagnosis not present

## 2023-06-01 DIAGNOSIS — R7303 Prediabetes: Secondary | ICD-10-CM

## 2023-06-01 DIAGNOSIS — I10 Essential (primary) hypertension: Secondary | ICD-10-CM | POA: Diagnosis not present

## 2023-06-01 DIAGNOSIS — E7800 Pure hypercholesterolemia, unspecified: Secondary | ICD-10-CM

## 2023-06-01 DIAGNOSIS — E559 Vitamin D deficiency, unspecified: Secondary | ICD-10-CM

## 2023-06-01 MED ORDER — AMLODIPINE BESYLATE 5 MG PO TABS: 5.0000 mg | ORAL_TABLET | Freq: Every day | ORAL | 1 refills | Status: DC

## 2023-06-01 MED ORDER — ATORVASTATIN CALCIUM 10 MG PO TABS: 10.0000 mg | ORAL_TABLET | Freq: Every day | ORAL | 1 refills | Status: DC

## 2023-06-01 MED ORDER — SYNTHROID 50 MCG PO TABS: 50.0000 ug | ORAL_TABLET | Freq: Every day | ORAL | 1 refills | Status: DC

## 2023-06-01 NOTE — Progress Notes (Signed)
 Established patient visit   Patient: Alison Blackwell   DOB: [birth year]   83 y.o. Female  MRN: [id number] Visit Date: 06/01/2023  Today's healthcare provider: Mimi Alt, MD   Chief Complaint  Patient presents with   Establish Care    Here to establish care with new PCP, was pt of dr Oletta Berry    Medication Refill    Pt would like to know wether she should start back up on synthroid   Subjective     HPI     Establish Care    Additional comments: Here to establish care with new PCP, was pt of dr Oletta Berry         Medication Refill    Additional comments: Pt would like to know wether she should start back up on synthroid      Last edited by Gennaro Khat on 06/01/2023  8:46 AM.       Discussed the use of AI scribe software for clinical note transcription with the patient, who gave verbal consent to proceed.  History of Present Illness Alison Blackwell is a 83 year old female who presents to re-establish care with a new PCP.  She has a history of essential hypertension with recent blood pressure readings of 149/69 mmHg. Her blood pressure fluctuates, and she seldom checks it at home. She has never been on antihypertensive medication.  She has hypercholesterolemia and was prescribed atorvastatin 10 mg daily, but she is not currently taking it as she is out of the medication.  She has postoperative hypothyroidism following thyroid removal in 1990. She was prescribed Synthroid 50 mcg daily but is not currently taking it. No symptoms such as constipation, depression, or low energy are reported.  Her medical history also includes chronic seasonal allergic rhinitis, gastric reflux, osteopenia of multiple sites, and low vitamin D . She is currently only taking a multivitamin. Her last A1c was in the prediabetes range in 2021, and she is not currently on any vitamin D  supplementation.     Past Medical History:  Diagnosis Date   Gastrointestinal ulcer due  to Helicobacter pylori 06/07/2002   Hyperlipidemia     Medications: Outpatient Medications Prior to Visit  Medication Sig   Multiple Vitamin (MULTI-VITAMINS) TABS Take 1 tablet by mouth daily.   [DISCONTINUED] aspirin EC 81 MG tablet Take 1 tablet by mouth daily. (Patient not taking: Reported on 06/01/2023)   [DISCONTINUED] atorvastatin (LIPITOR) 10 MG tablet TAKE 1 TABLET BY MOUTH EVERY DAY (Patient not taking: Reported on 06/01/2023)   [DISCONTINUED] cyclobenzaprine  (FLEXERIL ) 5 MG tablet Take 0.5-1 tablets (2.5-5 mg total) by mouth at bedtime. (Patient not taking: Reported on 04/05/2022)   [DISCONTINUED] Omega 3-6-9 CAPS Take 1 capsule by mouth daily. (Patient not taking: Reported on 04/05/2022)   [DISCONTINUED] SYNTHROID 50 MCG tablet ONCE DAILY BY MOUTH DO NOT SUBSTITUTE PLEASE (Patient not taking: Reported on 06/01/2023)   No facility-administered medications prior to visit.    Review of Systems  Last CBC Lab Results  Component Value Date   WBC 6.0 09/20/2021   HGB 13.9 09/20/2021   HCT 40.9 09/20/2021   MCV 91 09/20/2021   MCH 31.0 09/20/2021   RDW 12.6 09/20/2021   PLT 210 09/20/2021   Last metabolic panel Lab Results  Component Value Date   GLUCOSE 132 (H) 09/20/2021   NA 144 09/20/2021   K 3.9 09/20/2021   CL 104 09/20/2021   CO2 25 09/20/2021   BUN 12 09/20/2021  CREATININE 0.89 09/20/2021   EGFR 65 09/20/2021   CALCIUM 10.6 (H) 09/20/2021   PROT 6.8 09/20/2021   ALBUMIN 4.6 09/20/2021   LABGLOB 2.2 09/20/2021   AGRATIO 2.1 09/20/2021   BILITOT 1.4 (H) 09/20/2021   ALKPHOS 80 09/20/2021   AST 17 09/20/2021   ALT 13 09/20/2021   ANIONGAP 11 08/16/2020   Last lipids Lab Results  Component Value Date   CHOL 159 09/20/2021   HDL 64 09/20/2021   LDLCALC 80 09/20/2021   TRIG 81 09/20/2021   CHOLHDL 2.5 09/20/2021   Last hemoglobin A1c Lab Results  Component Value Date   HGBA1C 6.0 (H) 12/25/2019   Last thyroid functions Lab Results  Component Value  Date   TSH 0.280 (L) 09/20/2021   Last vitamin D  Lab Results  Component Value Date   VD25OH 101.0 (H) 12/25/2019        Objective    BP (!) 149/69 (BP Location: Right Arm, Patient Position: Standing;Sitting, Cuff Size: Normal)   Pulse 85   Resp 16   Ht 5\' 6"  (1.676 m)   Wt 151 lb 3.2 oz (68.6 kg)   SpO2 99%   BMI 24.40 kg/m   BP Readings from Last 3 Encounters:  06/01/23 (!) 149/69  09/20/21 (!) 153/86  03/23/21 (!) 145/90   Wt Readings from Last 3 Encounters:  06/01/23 151 lb 3.2 oz (68.6 kg)  04/26/23 145 lb (65.8 kg)  04/05/22 140 lb (63.5 kg)        Physical Exam Vitals reviewed.  Constitutional:      General: She is not in acute distress.    Appearance: Normal appearance. She is not ill-appearing, toxic-appearing or diaphoretic.  Eyes:     Conjunctiva/sclera: Conjunctivae normal.  Cardiovascular:     Rate and Rhythm: Normal rate and regular rhythm.     Pulses: Normal pulses.     Heart sounds: Normal heart sounds. No murmur heard.    No friction rub. No gallop.  Pulmonary:     Effort: Pulmonary effort is normal. No respiratory distress.     Breath sounds: Normal breath sounds. No stridor. No wheezing, rhonchi or rales.  Abdominal:     General: Bowel sounds are normal. There is no distension.     Palpations: Abdomen is soft.     Tenderness: There is no abdominal tenderness.  Musculoskeletal:     Right lower leg: No edema.     Left lower leg: No edema.  Skin:    Findings: No erythema or rash.  Neurological:     Mental Status: She is alert and oriented to person, place, and time.  Psychiatric:        Mood and Affect: Mood and affect normal.        Speech: Speech normal.        Behavior: Behavior normal. Behavior is cooperative.     General: Alert, no acute distress Cardio: Normal S1 and S2, RRR, no r/m/g Pulm: CTAB, normal work of breathing ABD: soft, abdomen is not distended, there is no tenderness to palpation, normal BS  Extremities: no LE  edema    No results found for any visits on 06/01/23.  Assessment & Plan     Problem List Items Addressed This Visit       Cardiovascular and Mediastinum   Essential (primary) hypertension - Primary   Chronic hypertension with current reading of 149/69 mmHg. No history of antihypertensive medication use. Blood pressure fluctuates and is seldom checked at home. Discussed  risks of uncontrolled hypertension including heart failure, myocardial infarction, and cerebrovascular accidents. She is open to starting medication. Amlodipine 5 mg once daily was chosen to manage blood pressure, with a goal to maintain systolic in the 130s and diastolic in the high 60s to 70s. - Prescribe amlodipine 5 mg once daily. CMP ordered today  - Instruct to monitor blood pressure at home, particularly after taking medication, and keep a record. - Schedule follow-up in one month to assess response to medication and check for side effects such as lightheadedness or dizziness.      Relevant Medications   atorvastatin (LIPITOR) 10 MG tablet   amLODipine (NORVASC) 5 MG tablet   Other Relevant Orders   CMP14+EGFR     Respiratory   Allergic rhinitis   Chronic  Asymptomatic currently  No meds         Digestive   Acid reflux   Chronic  Asymptomatic  No current meds for this         Endocrine   Hypothyroidism, postop   Postoperative hypothyroidism Chronic  Postoperative hypothyroidism following thyroidectomy in 1990. Previously on Synthroid 50 mcg daily but not currently taking it. No symptoms of hypothyroidism reported. Plan to reassess thyroid function with lab work. Given the complete thyroidectomy, likely need to restart Synthroid. - Prescribe Synthroid 50 mcg daily. - Order TSH, T4, and T3 to assess thyroid function.      Relevant Medications   SYNTHROID 50 MCG tablet   Other Relevant Orders   TSH+T4F+T3Free     Musculoskeletal and Integument   Osteopenia   Noted in history  No current  meds         Other   Hypercholesteremia   Hypercholesterolemia Chronic  Hypercholesterolemia with previous atorvastatin 10 mg daily prescription, currently not taking it. Plan to reassess cholesterol levels with lab work. - Prescribe atorvastatin 10 mg daily. - Order lipid panel to assess current cholesterol levels.      Relevant Medications   atorvastatin (LIPITOR) 10 MG tablet   amLODipine (NORVASC) 5 MG tablet   Other Relevant Orders   Lipid panel   Avitaminosis D   Chronic  Vitamin D  deficiency. Plan to reassess vitamin D  levels with lab work. - Order vitamin D  level to assess current status. - not currently on supplementation       Relevant Orders   VITAMIN D  25 Hydroxy (Vit-D Deficiency, Fractures)   Other Visit Diagnoses       Prediabetes       Relevant Orders   Hemoglobin A1c        Assessment & Plan Wellness Visit Re-establishing care with a new primary care provider. Comprehensive review of medical history and current medications. Last labs were conducted four years ago. - Order comprehensive lab work including TSH, T4, T3, lipid panel, CMP, vitamin D , and A1c screening. - Schedule follow-up appointment in one month to review lab results and blood pressure management.      Return in about 1 month (around 07/02/2023) for HTN.         Mimi Alt, MD  [facility] [phone] (phone) [phone] (fax)  [facility]

## 2023-06-01 NOTE — Assessment & Plan Note (Signed)
 Hypercholesterolemia Chronic  Hypercholesterolemia with previous atorvastatin 10 mg daily prescription, currently not taking it. Plan to reassess cholesterol levels with lab work. - Prescribe atorvastatin 10 mg daily. - Order lipid panel to assess current cholesterol levels.

## 2023-06-01 NOTE — Assessment & Plan Note (Signed)
 Chronic  Asymptomatic currently  No meds

## 2023-06-01 NOTE — Assessment & Plan Note (Signed)
 Chronic  Asymptomatic  No current meds for this

## 2023-06-01 NOTE — Assessment & Plan Note (Signed)
 Chronic  Vitamin D  deficiency. Plan to reassess vitamin D  levels with lab work. - Order vitamin D  level to assess current status. - not currently on supplementation

## 2023-06-01 NOTE — Assessment & Plan Note (Signed)
 Chronic hypertension with current reading of 149/69 mmHg. No history of antihypertensive medication use. Blood pressure fluctuates and is seldom checked at home. Discussed risks of uncontrolled hypertension including heart failure, myocardial infarction, and cerebrovascular accidents. She is open to starting medication. Amlodipine 5 mg once daily was chosen to manage blood pressure, with a goal to maintain systolic in the 130s and diastolic in the high 60s to 70s. - Prescribe amlodipine 5 mg once daily. CMP ordered today  - Instruct to monitor blood pressure at home, particularly after taking medication, and keep a record. - Schedule follow-up in one month to assess response to medication and check for side effects such as lightheadedness or dizziness.

## 2023-06-01 NOTE — Assessment & Plan Note (Signed)
 Postoperative hypothyroidism Chronic  Postoperative hypothyroidism following thyroidectomy in 1990. Previously on Synthroid 50 mcg daily but not currently taking it. No symptoms of hypothyroidism reported. Plan to reassess thyroid function with lab work. Given the complete thyroidectomy, likely need to restart Synthroid. - Prescribe Synthroid 50 mcg daily. - Order TSH, T4, and T3 to assess thyroid function.

## 2023-06-01 NOTE — Assessment & Plan Note (Signed)
 Noted in history  No current meds

## 2023-06-02 ENCOUNTER — Encounter: Payer: Self-pay | Admitting: Family Medicine

## 2023-06-02 LAB — CMP14+EGFR
ALT: 16 IU/L (ref 0–32)
AST: 19 IU/L (ref 0–40)
Albumin: 4.3 g/dL (ref 3.7–4.7)
Alkaline Phosphatase: 83 IU/L (ref 44–121)
BUN/Creatinine Ratio: 12 (ref 12–28)
BUN: 10 mg/dL (ref 8–27)
Bilirubin Total: 1.3 mg/dL — ABNORMAL HIGH (ref 0.0–1.2)
CO2: 24 mmol/L (ref 20–29)
Calcium: 10 mg/dL (ref 8.7–10.3)
Chloride: 102 mmol/L (ref 96–106)
Creatinine, Ser: 0.82 mg/dL (ref 0.57–1.00)
Globulin, Total: 2.6 g/dL (ref 1.5–4.5)
Glucose: 123 mg/dL — ABNORMAL HIGH (ref 70–99)
Potassium: 3.9 mmol/L (ref 3.5–5.2)
Sodium: 141 mmol/L (ref 134–144)
Total Protein: 6.9 g/dL (ref 6.0–8.5)
eGFR: 71 mL/min/{1.73_m2} (ref >59)

## 2023-06-02 LAB — LIPID PANEL
Chol/HDL Ratio: 4.7 ratio — ABNORMAL HIGH (ref 0.0–4.4)
Cholesterol, Total: 265 mg/dL — ABNORMAL HIGH (ref 100–199)
HDL: 56 mg/dL (ref >39)
LDL Chol Calc (NIH): 180 mg/dL — ABNORMAL HIGH (ref 0–99)
Triglycerides: 156 mg/dL — ABNORMAL HIGH (ref 0–149)
VLDL Cholesterol Cal: 29 mg/dL (ref 5–40)

## 2023-06-02 LAB — TSH+T4F+T3FREE
Free T4: 0.93 ng/dL (ref 0.82–1.77)
T3, Free: 2.9 pg/mL (ref 2.0–4.4)
TSH: 1.58 u[IU]/mL (ref 0.450–4.500)

## 2023-06-02 LAB — VITAMIN D 25 HYDROXY (VIT D DEFICIENCY, FRACTURES): Vit D, 25-Hydroxy: 58.8 ng/mL (ref 30.0–100.0)

## 2023-06-02 LAB — HEMOGLOBIN A1C
Est. average glucose Bld gHb Est-mCnc: 128 mg/dL
Hgb A1c MFr Bld: 6.1 % — ABNORMAL HIGH (ref 4.8–5.6)

## 2023-07-11 ENCOUNTER — Ambulatory Visit: Admitting: Family Medicine

## 2023-07-11 ENCOUNTER — Encounter: Payer: Self-pay | Admitting: Family Medicine

## 2023-07-11 VITALS — BP 155/79 | HR 94 | Ht 66.0 in | Wt 147.0 lb

## 2023-07-11 DIAGNOSIS — R0981 Nasal congestion: Secondary | ICD-10-CM | POA: Diagnosis not present

## 2023-07-11 DIAGNOSIS — I10 Essential (primary) hypertension: Secondary | ICD-10-CM | POA: Diagnosis not present

## 2023-07-11 DIAGNOSIS — H938X3 Other specified disorders of ear, bilateral: Secondary | ICD-10-CM | POA: Diagnosis not present

## 2023-07-11 MED ORDER — AZELASTINE HCL 0.1 % NA SOLN: 1.0000 | Freq: Two times a day (BID) | NASAL | 12 refills | Status: AC

## 2023-07-11 MED ORDER — AMLODIPINE-OLMESARTAN 5-20 MG PO TABS: 1.0000 | ORAL_TABLET | Freq: Every day | ORAL | 1 refills | Status: DC

## 2023-07-11 NOTE — Progress Notes (Signed)
 Established patient visit   Patient: Alison Blackwell   DOB: Apr 24, 1940   83 y.o. Female  MRN: [id number] Visit Date: 07/11/2023  Today's healthcare provider: Mimi Alt, MD   Chief Complaint  Patient presents with   Hypertension   Subjective       Discussed the use of AI scribe software for clinical note transcription with the patient, who gave verbal consent to proceed.  History of Present Illness Alison Blackwell is an 83 year old female with hypertension and hypothyroidism who presents for a follow-up on her blood pressure management.  She is currently on amlodipine  5 mg for hypertension but has not been taking her medication as prescribed. Her blood pressure management is suboptimal with a reading of 155/79 mmHg today, and she has not been measuring her blood pressure at home. She reports feeling well without dizziness or peripheral edema.  She is also on Synthroid  50 mcg for hypothyroidism and Lipitor 10 mg for cholesterol management. Additionally, she takes a multivitamin.  She has been experiencing sinus congestion and a sensation of stuffy ears for the past week, using Flonase  nasal spray for relief. She denies cough, chills, fever, sore throat, facial pain, sneezing, itching, and watery eyes. Her symptoms are described as pressure in her sinuses without associated pain.     Past Medical History:  Diagnosis Date   Gastrointestinal ulcer due to Helicobacter pylori 06/07/2002   Hyperlipidemia     Medications: Outpatient Medications Prior to Visit  Medication Sig   atorvastatin  (LIPITOR) 10 MG tablet Take 1 tablet (10 mg total) by mouth daily.   Multiple Vitamin (MULTI-VITAMINS) TABS Take 1 tablet by mouth daily.   SYNTHROID  50 MCG tablet Take 1 tablet (50 mcg total) by mouth daily before breakfast.   [DISCONTINUED] amLODipine  (NORVASC ) 5 MG tablet Take 1 tablet (5 mg total) by mouth daily.   No facility-administered medications prior to  visit.    Review of Systems  Last metabolic panel Lab Results  Component Value Date   GLUCOSE 123 (H) 06/01/2023   NA 141 06/01/2023   K 3.9 06/01/2023   CL 102 06/01/2023   CO2 24 06/01/2023   BUN 10 06/01/2023   CREATININE 0.82 06/01/2023   EGFR 71 06/01/2023   CALCIUM  10.0 06/01/2023   PROT 6.9 06/01/2023   ALBUMIN 4.3 06/01/2023   LABGLOB 2.6 06/01/2023   AGRATIO 2.1 09/20/2021   BILITOT 1.3 (H) 06/01/2023   ALKPHOS 83 06/01/2023   AST 19 06/01/2023   ALT 16 06/01/2023   ANIONGAP 11 08/16/2020        Objective    BP (!) 155/79   Pulse 94   Ht 5' 6 (1.676 m)   Wt 147 lb (66.7 kg)   SpO2 98%   BMI 23.73 kg/m  BP Readings from Last 3 Encounters:  07/11/23 (!) 155/79  06/01/23 (!) 149/69  09/20/21 (!) 153/86   Wt Readings from Last 3 Encounters:  07/11/23 147 lb (66.7 kg)  06/01/23 151 lb 3.2 oz (68.6 kg)  04/26/23 145 lb (65.8 kg)        Physical Exam  Physical Exam VITALS: BP- 146/68 HEENT: oropharynx is not erythematous, no postnasal drainage, TM are not erythematous, no bulging, she does have TM effusions bilaterally  CHEST: Lungs clear to auscultation. CARD: RRR  Physical Exam    No results found for any visits on 07/11/23.  Assessment & Plan     Problem List Items Addressed This Visit  Cardiovascular and Mediastinum   Essential (primary) hypertension - Primary   Relevant Medications   amLODipine -olmesartan (AZOR) 5-20 MG tablet   Other Visit Diagnoses       Sinus congestion         Ear congestion, bilateral           Assessment & Plan Hypertension Hypertension is not well-controlled with current medication regimen. Blood pressure is 155/79 mmHg, above the target of less than 140/90 mmHg. She reports non-adherence to amlodipine . No dizziness or leg swelling, indicating good tolerance to amlodipine . Discussed potential for white coat hypertension and importance of home blood pressure monitoring. Considered increasing  amlodipine  dose or adding another medication. Decided to prescribe a combination pill to simplify regimen and improve adherence. - Prescribe combination pill of amlodipine  5 mg and olmesartan 20 mg. - Advise obtaining a home blood pressure monitor to track readings. - Encourage lifestyle modifications including dietary changes and regular exercise to manage blood pressure.  Sinus Congestion Reports sinus congestion and stuffy ears for the past week. No pain, fever, or other systemic symptoms. Examination reveals fluid bubbles in the ears but no signs of infection. Current use of Flonase  nasal spray noted. No sneezing, itching, or watery eyes reported. - Prescribe Astelin nasal spray, one spray per nostril twice a day. - Advise monitoring for additional symptoms such as sneezing, itchy or watery eyes, and consider using cetirizine 10 mg daily if these occur. - Instruct to return if symptoms persist for more than 10 days or if fever or cough develops.  Hypothyroidism Currently on Synthroid  50 mcg. No new symptoms or concerns regarding thyroid function.  Hyperlipidemia Currently on Lipitor 10 mg. No new symptoms or concerns regarding lipid levels.  General Health Maintenance Discussed the importance of lifestyle modifications to support overall health, particularly in relation to blood pressure management. - Encourage regular physical activity such as walking. - Advise dietary modifications to reduce salt intake.     No follow-ups on file.       Mimi Alt, MD  [facility] [phone] (phone) [phone] (fax)  [facility]

## 2023-08-16 ENCOUNTER — Encounter: Payer: Self-pay | Admitting: Family Medicine

## 2023-08-16 ENCOUNTER — Ambulatory Visit: Admitting: Family Medicine

## 2023-08-16 VITALS — BP 129/70 | HR 76 | Temp 97.3°F | Ht 66.0 in | Wt 149.7 lb

## 2023-08-16 DIAGNOSIS — I10 Essential (primary) hypertension: Secondary | ICD-10-CM | POA: Diagnosis not present

## 2023-08-16 DIAGNOSIS — E890 Postprocedural hypothyroidism: Secondary | ICD-10-CM | POA: Diagnosis not present

## 2023-08-16 DIAGNOSIS — E7800 Pure hypercholesterolemia, unspecified: Secondary | ICD-10-CM | POA: Diagnosis not present

## 2023-08-16 NOTE — Progress Notes (Signed)
 Established patient visit   Patient: Alison Blackwell   DOB: March 28, 1940   83 y.o. Female  MRN: [id number] Visit Date: 08/16/2023  Today's healthcare provider: Rockie Agent, MD   Chief Complaint  Patient presents with   Medical Management of Chronic Issues    5wk f/u    Hypertension    Has not checked BP at home  Reports compliance with htn med mgmt. No side effects from medication and no dizziness, headaches, no blurred vision    Subjective     HPI     Medical Management of Chronic Issues    Additional comments: 5wk f/u         Hypertension    Additional comments: Has not checked BP at home  Reports compliance with htn med mgmt. No side effects from medication and no dizziness, headaches, no blurred vision       Last edited by Cherry Chiquita HERO, CMA on 08/16/2023 11:08 AM.       Discussed the use of AI scribe software for clinical note transcription with the patient, who gave verbal consent to proceed.  History of Present Illness Alison Blackwell is an 83 year old female with hypertension and diabetes who presents for a chronic follow-up visit.  She takes olmesartan  20 mg daily for hypertension with no new symptoms or issues with her current medications. Her medication supply is automated for refills.  Her most recent A1c was 6.1. She manages her diabetes with her current medication regimen and has no new symptoms.  She is on Synthroid  50 mcg daily and Lipitor 10 mg daily for hypercholesterolemia. Her cholesterol management is ongoing, and she reports no new symptoms related to her cholesterol management.  Her most recent vitamin D  level was 58, and she continues her current vitamin D  supplementation.  No new symptoms or issues with her current medications.     Past Medical History:  Diagnosis Date   Gastrointestinal ulcer due to Helicobacter pylori 06/07/2002   Hyperlipidemia     Medications: Outpatient Medications Prior to Visit   Medication Sig   amLODipine -olmesartan  (AZOR ) 5-20 MG tablet Take 1 tablet by mouth daily.   atorvastatin  (LIPITOR) 10 MG tablet Take 1 tablet (10 mg total) by mouth daily.   azelastine  (ASTELIN ) 0.1 % nasal spray Place 1 spray into both nostrils 2 (two) times daily. Use in each nostril as directed   Multiple Vitamin (MULTI-VITAMINS) TABS Take 1 tablet by mouth daily.   SYNTHROID  50 MCG tablet Take 1 tablet (50 mcg total) by mouth daily before breakfast.   No facility-administered medications prior to visit.    Review of Systems  Last metabolic panel Lab Results  Component Value Date   GLUCOSE 123 (H) 06/01/2023   NA 141 06/01/2023   K 3.9 06/01/2023   CL 102 06/01/2023   CO2 24 06/01/2023   BUN 10 06/01/2023   CREATININE 0.82 06/01/2023   EGFR 71 06/01/2023   CALCIUM  10.0 06/01/2023   PROT 6.9 06/01/2023   ALBUMIN 4.3 06/01/2023   LABGLOB 2.6 06/01/2023   AGRATIO 2.1 09/20/2021   BILITOT 1.3 (H) 06/01/2023   ALKPHOS 83 06/01/2023   AST 19 06/01/2023   ALT 16 06/01/2023   ANIONGAP 11 08/16/2020   Last lipids Lab Results  Component Value Date   CHOL 265 (H) 06/01/2023   HDL 56 06/01/2023   LDLCALC 180 (H) 06/01/2023   TRIG 156 (H) 06/01/2023   CHOLHDL 4.7 (H) 06/01/2023   The  ASCVD Risk score (Arnett DK, et al., 2019) failed to calculate for the following reasons:   The 2019 ASCVD risk score is only valid for ages 63 to 37  Last hemoglobin A1c Lab Results  Component Value Date   HGBA1C 6.1 (H) 06/01/2023   Last thyroid functions Lab Results  Component Value Date   TSH 1.580 06/01/2023   Last vitamin D  Lab Results  Component Value Date   VD25OH 58.8 06/01/2023        Objective    BP 129/70 (BP Location: Right Arm, Patient Position: Sitting, Cuff Size: Normal)   Pulse 76   Temp (!) 97.3 F (36.3 C) (Oral)   Ht 5' 6 (1.676 m)   Wt 149 lb 11.2 oz (67.9 kg)   SpO2 100%   BMI 24.16 kg/m      Physical Exam  Physical Exam CHEST: Lungs clear to  auscultation, no wheezing, no crackles. CARDIOVASCULAR: Regular rate and rhythm. ABDOMEN: Normal bowel sounds, no tenderness. EXTREMITIES: No lower extremity edema.    No results found for any visits on 08/16/23.  Assessment & Plan     Problem List Items Addressed This Visit       Cardiovascular and Mediastinum   Essential (primary) hypertension - Primary     Endocrine   Hypothyroidism, postop     Other   Hypercholesteremia    Assessment & Plan PreDM  A1c of 6.1%. - recommend monitoring carbohydrate intake to lower glucose levels   Hypertension Chronic  Blood pressure is well controlled at 129/70 mmHg with current management. - Continue amlodipine  5mg  and olmesartan  20 mg daily. - Schedule follow-up in January to monitor blood pressure control.  Hypercholesterolemia Chronic Cholesterol management is ongoing with Lipitor. She is outside the age range for ASCVD score calculation. - Continue Lipitor 10 mg daily.  Hypothyroidism Chronic stable Hypothyroidism is managed with Synthroid  without issues. - Continue Synthroid  50 mcg daily.  Osteopenia Osteopenia is managed with vitamin D  supplementation. Vitamin D  level is well controlled at 58. - Continue vitamin D  supplementation.     Return in about 6 months (around 02/16/2024) for HTN, Cholesterol.         Rockie Agent, MD  [facility] [phone] (phone) [phone] (fax)  [facility]

## 2023-08-29 ENCOUNTER — Emergency Department

## 2023-08-29 ENCOUNTER — Emergency Department
Admission: EM | Admit: 2023-08-29 | Discharge: 2023-08-29 | Disposition: A | Discharge status: Home / Self Care | Attending: Emergency Medicine | Admitting: Emergency Medicine

## 2023-08-29 ENCOUNTER — Ambulatory Visit: Payer: Self-pay

## 2023-08-29 DIAGNOSIS — W01198A Fall on same level from slipping, tripping and stumbling with subsequent striking against other object, initial encounter: Secondary | ICD-10-CM | POA: Diagnosis not present

## 2023-08-29 DIAGNOSIS — S0101XA Laceration without foreign body of scalp, initial encounter: Secondary | ICD-10-CM | POA: Diagnosis not present

## 2023-08-29 DIAGNOSIS — S0990XA Unspecified injury of head, initial encounter: Secondary | ICD-10-CM

## 2023-08-29 DIAGNOSIS — R9082 White matter disease, unspecified: Secondary | ICD-10-CM | POA: Diagnosis not present

## 2023-08-29 DIAGNOSIS — Y92002 Bathroom of unspecified non-institutional (private) residence single-family (private) house as the place of occurrence of the external cause: Secondary | ICD-10-CM | POA: Insufficient documentation

## 2023-08-29 DIAGNOSIS — I10 Essential (primary) hypertension: Secondary | ICD-10-CM | POA: Insufficient documentation

## 2023-08-29 DIAGNOSIS — W19XXXA Unspecified fall, initial encounter: Secondary | ICD-10-CM

## 2023-08-29 DIAGNOSIS — S0081XA Abrasion of other part of head, initial encounter: Secondary | ICD-10-CM | POA: Diagnosis not present

## 2023-08-29 HISTORY — DX: Disorder of thyroid, unspecified: E07.9

## 2023-08-29 NOTE — Discharge Instructions (Signed)
 The CT scan of your head was normal today.  The wound on the back of your head was closed using skin glue.  This will begin to fall off on its own in about a week.  You can wash your hair like normal.  Be careful when styling your hair that you do not catch your comb in the glue.  Although you do not have symptoms of a concussion right now, this may develop over the next couple days.  The symptoms include headache, nausea, vomiting, light sensitivity, difficulty sleeping, confusion, dizziness.  If the symptoms persist for longer than 2 weeks please be evaluated by a healthcare provider.  Return to the emergency department with any worsening symptoms.

## 2023-08-29 NOTE — ED Provider Notes (Signed)
 [facility] Provider Note    Event Date/Time   First MD Initiated Contact with Patient 08/29/23 1336     (approximate)   History   Fall and Head Laceration   HPI  Alison Blackwell is a 83 y.o. female with PMH of hyperlipidemia, thyroid disease and hypertension who presents for evaluation after a fall this morning. Patient states she slipped on towel on her bathroom floor causing her to fall backwards into the tub and hit her head on the soap holder. She did not lose consciousness, no blood thinners. She denies any pain at this time. No nausea, vomiting, dizziness, confusion, visual changes.       Physical Exam   Triage Vital Signs: ED Triage Vitals [08/29/23 1144]  Encounter Vitals Group     BP (!) 152/86     Girls Systolic BP Percentile      Girls Diastolic BP Percentile      Boys Systolic BP Percentile      Boys Diastolic BP Percentile      Pulse Rate 98     Resp 16     Temp 98.4 F (36.9 C)     Temp Source Oral     SpO2 98 %     Weight 149 lb (67.6 kg)     Height 5' 6 (1.676 m)     Head Circumference      Peak Flow      Pain Score 7     Pain Loc      Pain Education      Exclude from Growth Chart     Most recent vital signs: Vitals:   08/29/23 1144  BP: (!) 152/86  Pulse: 98  Resp: 16  Temp: 98.4 F (36.9 C)  SpO2: 98%   General: Awake, no distress.  CV:  Good peripheral perfusion. RRR. Resp:  Normal effort. CTAB. Abd:  No distention.  Other:  PERRL, EOM intact, no focal neuro deficits, no ataxia, no pronator drift. Less than 1 cm superficial laceration with overlying abrasion to the left occiput, no active bleeding.    ED Results / Procedures / Treatments   Labs (all labs ordered are listed, but only abnormal results are displayed) Labs Reviewed - No data to display   RADIOLOGY  CT head obtained, I interpreted the images as well as reviewed the radiologist report which was negative for any acute intracranial  abnormalities.   PROCEDURES:  Critical Care performed: No  Procedures   MEDICATIONS ORDERED IN ED: Medications - No data to display   IMPRESSION / MDM / ASSESSMENT AND PLAN / ED COURSE  I reviewed the triage vital signs and the nursing notes.                             83 year old female presents for evaluation of a head injury after a fall. BP is elevated, otherwise VSS. Patient NAD on exam.   Differential diagnosis includes, but is not limited to, laceration, abrasion, skull fracture, intracranial bleed, closed head injury.  Patient's presentation is most consistent with acute complicated illness / injury requiring diagnostic workup.  CT head is negative for acute abnormalities.   Laceration on patient's head is not actively bleeding, well approximated and superficial. Wound was closed using skin glue. Patient was advised on wound care. We reviewed return precautions and signs of a concussion to watch for. Low suspicion for a concussion at this  time as patient is asymptomatic. She voiced understanding, all questions were answered and she was stable at discharge.     FINAL CLINICAL IMPRESSION(S) / ED DIAGNOSES   Final diagnoses:  Injury of head, initial encounter  Fall, initial encounter     Rx / DC Orders   ED Discharge Orders     None        Note:  This document was prepared using Dragon voice recognition software and may include unintentional dictation errors.   Cleaster Tinnie LABOR, PA-C 08/29/23 1406    Claudene Rover, MD 08/29/23 [phone]

## 2023-08-29 NOTE — Telephone Encounter (Signed)
 FYI Only or Action Required?: FYI only for provider.  Patient was last seen in primary care on 08/16/2023 by Sharma Coyer, MD.  Called Nurse Triage reporting Head Injury.  Symptoms began today.  Interventions attempted: Nothing.  Symptoms are: unchanged.  Triage Disposition: Go to ED Now (or PCP Triage)  Patient/caregiver understands and will follow disposition?: Yes      Copied from CRM #[id number]. Topic: Clinical - Red Word Triage >> Aug 29, 2023 10:57 AM [name] wrote: Red Word that prompted transfer to Nurse Triage: Pt fell and hit her head on her tub. It is bleeding and continuously bleeding. Pt is fine able to walk and talk. No dizziness or blurred vision. Pt is on blood thinners      Reason for Disposition  Taking Coumadin (warfarin) or other strong blood thinner, or known bleeding disorder (e.g., thrombocytopenia)  Answer Assessment - Initial Assessment Questions 1. MECHANISM: How did the injury happen? For falls, ask: What height did you fall from? and What surface did you fall against?      Hit head on bathtub 2. ONSET: When did the injury happen? (e.g., minutes, hours ago)      Today  3. NEUROLOGIC SYMPTOMS: Was there any loss of consciousness? Are there any other neurological symptoms?      No 4. MENTAL STATUS: Does the person know who they are, who you are, and where they are?      Alert and oriented  5. LOCATION: What part of the head was hit?      Back of head  6. SCALP APPEARANCE: What does the scalp look like? Is it bleeding now? If Yes, ask: Is it difficult to stop?      Bleeding  7. SIZE: For cuts, bruises, or swelling, ask: How large is it? (e.g., inches or centimeters)      Unsure  8. PAIN: Is there any pain? If Yes, ask: How bad is it? (Scale 0-10; or none, mild, moderate, severe)     Mild to moderate  10. BLOOD THINNERS: Do you take any blood thinners? (e.g., aspirin, clopidogrel / Plavix, coumadin, heparin).  Notes: Other strong blood thinners include: Arixtra (fondaparinux), Eliquis (apixaban), Pradaxa (dabigatran), and Xarelto (rivaroxaban).       Yes 11. OTHER SYMPTOMS: Do you have any other symptoms? (e.g., neck pain, vomiting)       No  Protocols used: Head Injury-A-AH

## 2023-08-29 NOTE — ED Triage Notes (Signed)
 Pt c/o laceration to posterior head and soreness following a slip and fall in the shower this morning.  Pain score 7/10.  Pt denies LOC and dizziness.  No blood thinners.  Bleeding is controlled.

## 2023-09-13 ENCOUNTER — Encounter: Payer: Self-pay | Admitting: Family Medicine

## 2023-09-13 ENCOUNTER — Ambulatory Visit: Admitting: Family Medicine

## 2023-09-13 VITALS — BP 116/53 | HR 86 | Resp 16 | Ht 66.0 in | Wt 149.5 lb

## 2023-09-13 DIAGNOSIS — M25562 Pain in left knee: Secondary | ICD-10-CM | POA: Diagnosis not present

## 2023-09-13 DIAGNOSIS — S0990XD Unspecified injury of head, subsequent encounter: Secondary | ICD-10-CM

## 2023-09-13 MED ORDER — DICLOFENAC SODIUM 1 % EX GEL: 4.0000 g | Freq: Four times a day (QID) | CUTANEOUS | 1 refills | Status: DC

## 2023-09-13 MED ORDER — MELOXICAM 7.5 MG PO TABS: 7.5000 mg | ORAL_TABLET | Freq: Every day | ORAL | 0 refills | Status: DC

## 2023-09-13 NOTE — Progress Notes (Signed)
 Established patient visit   Patient: Alison Blackwell   DOB: [birth year]   83 y.o. Female  MRN: [id number] Visit Date: 09/13/2023  Today's healthcare provider: Rockie Agent, MD   Chief Complaint  Patient presents with   Hospitalization Follow-up   Fall    Knee pain on left and left scalp wound     Subjective     HPI     Fall    Additional comments: Knee pain on left and left scalp wound        Last edited by Agent Rockie, MD on 09/13/2023  2:27 PM.       Discussed the use of AI scribe software for clinical note transcription with the patient, who gave verbal consent to proceed.  History of Present Illness Alison Blackwell is a 83 year old female who presents for hospital follow-up after a head injury.  She experienced a head injury on August 5th after slipping on a tile in the bathroom, falling backwards into the tub, and hitting her head on a soap holder. There was no loss of consciousness, and she was not on blood thinners at the time. A CT scan of her head showed no acute abnormalities, and there was no active bleeding from her scalp. A superficial wound was closed with skin glue. She has felt well since the incident, with no headaches or vision changes.  She reports a new issue with her left knee, which began after sitting in a rocking chair on her front porch. She describes sudden pain in the left medial knee upon standing, without any preceding trauma or fall. The pain has been present for about a week and is described as tender. She reports no previous issues with the knee. She has been using a heating pad at home for relief. The pain is not as severe as initially but persists.  No other pain, headaches, or vision changes. She is able to move all extremities normally and remains active to work out the soreness from the fall.     Past Medical History:  Diagnosis Date   Gastrointestinal ulcer due to Helicobacter pylori 06/07/2002    Hyperlipidemia    Thyroid disease     Medications: Outpatient Medications Prior to Visit  Medication Sig   amLODipine -olmesartan  (AZOR ) 5-20 MG tablet Take 1 tablet by mouth daily.   atorvastatin  (LIPITOR) 10 MG tablet Take 1 tablet (10 mg total) by mouth daily.   azelastine  (ASTELIN ) 0.1 % nasal spray Place 1 spray into both nostrils 2 (two) times daily. Use in each nostril as directed   Multiple Vitamin (MULTI-VITAMINS) TABS Take 1 tablet by mouth daily.   SYNTHROID  50 MCG tablet Take 1 tablet (50 mcg total) by mouth daily before breakfast.   No facility-administered medications prior to visit.    Review of Systems  Last metabolic panel Lab Results  Component Value Date   GLUCOSE 123 (H) 06/01/2023   NA 141 06/01/2023   K 3.9 06/01/2023   CL 102 06/01/2023   CO2 24 06/01/2023   BUN 10 06/01/2023   CREATININE 0.82 06/01/2023   EGFR 71 06/01/2023   CALCIUM  10.0 06/01/2023   PROT 6.9 06/01/2023   ALBUMIN 4.3 06/01/2023   LABGLOB 2.6 06/01/2023   AGRATIO 2.1 09/20/2021   BILITOT 1.3 (H) 06/01/2023   ALKPHOS 83 06/01/2023   AST 19 06/01/2023   ALT 16 06/01/2023   ANIONGAP 11 08/16/2020     {See past labs  Heme  Chem  Endocrine  Serology  Results Review (optional):1}   Objective    BP (!) 116/53 (BP Location: Right Arm, Patient Position: Sitting, Cuff Size: Normal)   Pulse 86   Resp 16   Ht 5' 6 (1.676 m)   Wt 149 lb 8 oz (67.8 kg)   SpO2 98%   BMI 24.13 kg/m  BP Readings from Last 3 Encounters:  09/13/23 (!) 116/53  08/29/23 (!) 151/80  08/16/23 129/70   Wt Readings from Last 3 Encounters:  09/13/23 149 lb 8 oz (67.8 kg)  08/29/23 149 lb (67.6 kg)  08/16/23 149 lb 11.2 oz (67.9 kg)    {See vitals history (optional):1}    Physical Exam  Physical Exam MUSCULOSKELETAL: No swelling, joint effusion, or crepitus in left knee. Point tenderness over medial aspect of left knee. Normal range of motion and strength in left knee, negative Thessaly exam.  Normal movement of all extremities. NEUROLOGICAL: Patient oriented to name. Moves all extremities without gross motor or sensory deficit.    No results found for any visits on 09/13/23.  Assessment & Plan     Problem List Items Addressed This Visit   None Visit Diagnoses       Injury of head, subsequent encounter    -  Primary     Acute pain of left knee       Relevant Medications   meloxicam  (MOBIC ) 7.5 MG tablet       Assessment and Plan Assessment & Plan Left medial knee pain Recent onset of left medial knee pain without trauma. No swelling or joint effusion. Point tenderness over the medial aspect. Normal range of motion and strength. Negative Thessaly exam. Differential includes tendon inflammation, possible tendon tear, meniscus wear, or arthritis. - Prescribe meloxicam  7.5 mg once daily for one month. Advise against additional NSAIDs. - Apply Voltaren  gel topically to the affected area. - Alternate heat and ice therapy, 10 minutes each, up to three times a day. - Schedule follow-up in one month to reassess knee pain. - Consider x-ray at follow-up if pain persists.  Healing superficial scalp wound after head injury Healing superficial scalp wound from head injury on August 5th. Closed with skin glue. No active bleeding or infection. No headaches or vision changes. Wound healing well with some glue present. - Allow glue to fall off naturally. - Use oil to gently massage and help break up the glue if needed.     Return in about 1 month (around 10/14/2023) for left knee pain .         Rockie Agent, MD  [facility] [phone] (phone) [phone] (fax)  [facility]

## 2023-09-13 NOTE — Patient Instructions (Signed)
                         Contains text generated by Abridge.                                 Contains text generated by Abridge.

## 2023-10-11 ENCOUNTER — Other Ambulatory Visit: Payer: Self-pay | Admitting: Family Medicine

## 2023-10-11 DIAGNOSIS — M25562 Pain in left knee: Secondary | ICD-10-CM

## 2023-10-24 ENCOUNTER — Ambulatory Visit: Admitting: Family Medicine

## 2023-10-24 ENCOUNTER — Encounter: Payer: Self-pay | Admitting: Family Medicine

## 2023-10-24 VITALS — BP 133/74 | HR 83 | Temp 97.5°F | Ht 66.0 in | Wt 148.6 lb

## 2023-10-24 DIAGNOSIS — Z23 Encounter for immunization: Secondary | ICD-10-CM | POA: Diagnosis not present

## 2023-10-24 DIAGNOSIS — M25562 Pain in left knee: Secondary | ICD-10-CM

## 2023-10-24 MED ORDER — DICLOFENAC SODIUM 1 % EX GEL: 4.0000 g | Freq: Four times a day (QID) | CUTANEOUS | 1 refills | Status: DC

## 2023-10-24 NOTE — Patient Instructions (Addendum)
  VISIT SUMMARY: You came in for a follow-up visit regarding your left knee pain, which has been bothering you for the past month. We discussed your symptoms, current treatments, and next steps for further evaluation and management.  YOUR PLAN: LEFT KNEE PAIN: You have been experiencing persistent pain on the medial side of your left knee, which worsens with prolonged sitting and stair climbing. The pain is bothersome but not severe. -We will order an x-ray of your left knee to check for arthritis or other possible causes. -Continue using Voltaren  gel up to four times daily. -If the x-ray does not show arthritis, we may refer you to an orthopedic specialist. -If the x-ray shows arthritis, we can discuss the option of a steroid injection.   Please report to [facility] located at:  [address]  [place], [place] [address]  You do not need an appointment to have xrays completed.   Our office will follow up with  results once available.      GENERAL HEALTH MAINTENANCE: We discussed the importance of getting your flu shot as part of your routine health maintenance. -You will receive a flu shot today.                      Contains text generated by Abridge.      To keep you healthy, please keep in mind the following health maintenance items that you are due for:   Health Maintenance Due  Topic Date Due   Mammogram  08/24/2023   Influenza Vaccine  08/25/2023     Best Wishes,   Dr. Lang

## 2023-10-24 NOTE — Progress Notes (Signed)
 Established patient visit   Patient: Alison Blackwell   DOB: Jun 18, 1940   83 y.o. Female  MRN: [id number] Visit Date: 10/24/2023  Today's healthcare provider: Rockie Agent, MD   Chief Complaint  Patient presents with   Knee Pain    Left knee pain- patient is unsure of onset. Not constant pain, patient believes it may be arthritis. States it is worse when she sits for a long time and goes to get up   Subjective     HPI     Knee Pain    Additional comments: Left knee pain- patient is unsure of onset. Not constant pain, patient believes it may be arthritis. States it is worse when she sits for a long time and goes to get up      Last edited by Cherry Chiquita HERO, CMA on 10/24/2023  8:08 AM.       Discussed the use of AI scribe software for clinical note transcription with the patient, who gave verbal consent to proceed.  History of Present Illness Alison Blackwell is an 83 year old female who presents with left knee pain for follow-up.  She has been experiencing persistent left knee pain for the past month, described as a 'nuisance pain' located on the medial side of the knee. The pain is exacerbated by sitting for long periods and going up and down stairs. It is not constant and does not occur in the morning when she first gets up, but she feels it when she moves. The pain is bothersome but not severe.  She has been using topical Voltaren  gel and taking meloxicam  7.5 mg orally once daily for the past month. There was some initial improvement with these treatments, but the pain persists. She is unsure which treatment was more effective.  The pain sometimes radiates to the upper or lower portion of her thigh. She also notes occasional swelling in the knee area. No severe or constant pain, and she describes it as 'not a terrible pain' and 'not excruciating'.     Past Medical History:  Diagnosis Date   Gastrointestinal ulcer due to Helicobacter pylori  06/07/2002   Hyperlipidemia    Thyroid disease     Medications: Outpatient Medications Prior to Visit  Medication Sig   amLODipine -olmesartan  (AZOR ) 5-20 MG tablet Take 1 tablet by mouth daily.   atorvastatin  (LIPITOR) 10 MG tablet Take 1 tablet (10 mg total) by mouth daily.   azelastine  (ASTELIN ) 0.1 % nasal spray Place 1 spray into both nostrils 2 (two) times daily. Use in each nostril as directed   Multiple Vitamin (MULTI-VITAMINS) TABS Take 1 tablet by mouth daily.   SYNTHROID  50 MCG tablet Take 1 tablet (50 mcg total) by mouth daily before breakfast.   [DISCONTINUED] diclofenac  Sodium (VOLTAREN  ARTHRITIS PAIN) 1 % GEL Apply 4 g topically 4 (four) times daily.   [DISCONTINUED] meloxicam  (MOBIC ) 7.5 MG tablet Take 1 tablet (7.5 mg total) by mouth daily.   No facility-administered medications prior to visit.    Review of Systems  Last metabolic panel Lab Results  Component Value Date   GLUCOSE 123 (H) 06/01/2023   NA 141 06/01/2023   K 3.9 06/01/2023   CL 102 06/01/2023   CO2 24 06/01/2023   BUN 10 06/01/2023   CREATININE 0.82 06/01/2023   EGFR 71 06/01/2023   CALCIUM  10.0 06/01/2023   PROT 6.9 06/01/2023   ALBUMIN 4.3 06/01/2023   LABGLOB 2.6 06/01/2023   AGRATIO 2.1 09/20/2021  BILITOT 1.3 (H) 06/01/2023   ALKPHOS 83 06/01/2023   AST 19 06/01/2023   ALT 16 06/01/2023   ANIONGAP 11 08/16/2020        Objective    BP 133/74 (BP Location: Right Arm, Patient Position: Sitting, Cuff Size: Normal)   Pulse 83   Temp (!) 97.5 F (36.4 C) (Oral)   Ht 5' 6 (1.676 m)   Wt 148 lb 9.6 oz (67.4 kg)   SpO2 99%   BMI 23.98 kg/m  BP Readings from Last 3 Encounters:  10/24/23 133/74  09/13/23 (!) 116/53  08/29/23 (!) 151/80   Wt Readings from Last 3 Encounters:  10/24/23 148 lb 9.6 oz (67.4 kg)  09/13/23 149 lb 8 oz (67.8 kg)  08/29/23 149 lb (67.6 kg)        Physical Exam Constitutional:      Appearance: She is well-developed and well-nourished.  Eyes:      General: No scleral icterus. Musculoskeletal:     Left knee:     Instability Tests: Medial McMurray test positive.  Skin:    General: Skin is intact.  Neurological:     Mental Status: She is alert.  Psychiatric:        Mood and Affect: Mood and affect normal.     Knee Musculoskeletal Exam  Inspection   Leg length disparity: no discrepancy   Right     Erythema: none       Effusion: none       Edema: none     Left     Erythema: none       Effusion: mild       Edema: none       Ecchymosis: none       Deformity: none       Alignment: normal       Previous incision: no previous incision  Palpation   Right     Right knee palpation is unremarkable.     Left     Increased warmth: none       Masses: none       Crepitus: none       Tenderness: none    Range of Motion   Left     Left knee range of motion is normal.    Strength   Left     Extension: 5/5.      Flexion: 5/5.    Instability   Left     Instability signs: none - stable     Varus stress grade: normal     Valgus stress grade: normal     Anterior drawer: normal     Posterior drawer: normal     Medial McMurray test: positive     Lachman: negative  General     Constitutional: appears stated age, well-developed and well-nourished   Scleral icterus: no   Labored breathing: no   Psychiatric: normal mood and affect   Neurological: alert   Skin: intact    No results found for any visits on 10/24/23.  Assessment & Plan     Problem List Items Addressed This Visit   None Visit Diagnoses       Acute pain of left knee    -  Primary   Relevant Medications   diclofenac  Sodium (VOLTAREN  ARTHRITIS PAIN) 1 % GEL   Other Relevant Orders   DG Knee 4 Views W/Patella Left     Flu vaccine need       Relevant Orders  Flu vaccine HIGH DOSE PF(Fluzone Trivalent)       Assessment & Plan Left knee pain Chronic left knee pain for over a month, primarily on the medial side, exacerbated by prolonged sitting  and stair climbing. Differential includes arthritis, patellofemoral pain, and possible meniscus injury. Previous treatment with topical Voltaren  gel and oral meloxicam  provided limited relief. Pain is a nuisance rather than severe. No significant swelling or acute distress. X-ray will help differentiate between arthritis and other causes. - Order x-ray of the left knee, four views with patella, to evaluate for arthritis. - Prescribe Voltaren  gel for topical use up to four times daily. - Consider referral to orthopedic specialist if x-ray does not indicate arthritis. - Discuss potential for steroid injection if x-ray indicates arthritis.  General Health Maintenance Flu vaccination discussed as part of routine health maintenance. - Administer flu shot.     Return in about 1 month (around 11/23/2023) for left knee pain .         Rockie Agent, MD  [facility] [phone] (phone) [phone] (fax)  [facility]

## 2023-11-22 ENCOUNTER — Other Ambulatory Visit: Payer: Self-pay | Admitting: Family Medicine

## 2023-11-22 DIAGNOSIS — E7800 Pure hypercholesterolemia, unspecified: Secondary | ICD-10-CM

## 2023-11-22 DIAGNOSIS — E890 Postprocedural hypothyroidism: Secondary | ICD-10-CM

## 2023-11-24 ENCOUNTER — Other Ambulatory Visit: Payer: Self-pay | Admitting: Family Medicine

## 2023-11-24 DIAGNOSIS — I10 Essential (primary) hypertension: Secondary | ICD-10-CM

## 2023-12-12 ENCOUNTER — Ambulatory Visit
Admission: RE | Admit: 2023-12-12 | Discharge: 2023-12-12 | Disposition: A | Discharge status: Home / Self Care | Source: Ambulatory Visit | Attending: Family Medicine | Admitting: Family Medicine

## 2023-12-12 DIAGNOSIS — Z1231 Encounter for screening mammogram for malignant neoplasm of breast: Secondary | ICD-10-CM | POA: Insufficient documentation

## 2023-12-18 ENCOUNTER — Ambulatory Visit: Payer: Self-pay | Admitting: Family Medicine

## 2023-12-26 DIAGNOSIS — H26493 Other secondary cataract, bilateral: Secondary | ICD-10-CM | POA: Diagnosis not present

## 2023-12-26 DIAGNOSIS — H26492 Other secondary cataract, left eye: Secondary | ICD-10-CM | POA: Diagnosis not present

## 2024-01-09 ENCOUNTER — Ambulatory Visit: Admitting: Podiatry

## 2024-01-09 ENCOUNTER — Ambulatory Visit: Admitting: Family Medicine

## 2024-01-09 ENCOUNTER — Encounter: Payer: Self-pay | Admitting: Family Medicine

## 2024-01-09 VITALS — BP 138/72 | HR 91 | Ht 66.0 in | Wt 147.1 lb

## 2024-01-09 DIAGNOSIS — E890 Postprocedural hypothyroidism: Secondary | ICD-10-CM

## 2024-01-09 DIAGNOSIS — G8929 Other chronic pain: Secondary | ICD-10-CM

## 2024-01-09 DIAGNOSIS — L97511 Non-pressure chronic ulcer of other part of right foot limited to breakdown of skin: Secondary | ICD-10-CM | POA: Diagnosis not present

## 2024-01-09 DIAGNOSIS — M8589 Other specified disorders of bone density and structure, multiple sites: Secondary | ICD-10-CM

## 2024-01-09 DIAGNOSIS — E7800 Pure hypercholesterolemia, unspecified: Secondary | ICD-10-CM

## 2024-01-09 DIAGNOSIS — I10 Essential (primary) hypertension: Secondary | ICD-10-CM

## 2024-01-09 DIAGNOSIS — R6889 Other general symptoms and signs: Secondary | ICD-10-CM | POA: Insufficient documentation

## 2024-01-09 DIAGNOSIS — M25562 Pain in left knee: Secondary | ICD-10-CM

## 2024-01-09 MED ORDER — DICLOFENAC SODIUM 1 % EX GEL: 4.0000 g | Freq: Four times a day (QID) | CUTANEOUS | 3 refills | Status: AC

## 2024-01-09 NOTE — Progress Notes (Signed)
 Established patient visit   Patient: Alison Blackwell   DOB: 1940/08/08   83 y.o. Female  MRN: [id number] Visit Date: 01/09/2024  Today's healthcare provider: Rockie Agent, MD   Chief Complaint  Patient presents with   Follow-up    Patient is present for follow up left knee pain, patient reports this is doing better and has not been bothered by this in a while    Subjective     HPI     Follow-up    Additional comments: Patient is present for follow up left knee pain, patient reports this is doing better and has not been bothered by this in a while       Last edited by Cherry Chiquita HERO, CMA on 01/09/2024  8:28 AM.       Discussed the use of AI scribe software for clinical note transcription with the patient, who gave verbal consent to proceed.  History of Present Illness Alison Blackwell is a 83 year old female who presents for a follow-up visit regarding chronic left knee pain.  Her chronic left knee pain has improved and is not currently bothersome. She was previously unable to complete an x-ray but reports feeling better now. She has been using Voltaren  gel, which has been effective in alleviating the pain. Initially, the pain occurred while sitting on the porch and feeling discomfort when getting up from an armchair, but it has resolved. She recalls the pain occurring while sitting on the porch and feeling discomfort when getting up from an armchair.  She continues to manage her chronic hypertension with amlodipine  and olmesartan , taking 5/20 mg daily. Her blood pressure is currently stable at 138/72.  She is also on atorvastatin  10 mg for hypercholesterolemia and Synthroid  50 mcg for hypothyroidism, with a sufficient supply of medications until the next scheduled visit.  Pt continues to have left knee pain  Was unable to have xray completed  Recommended    Past Medical History:  Diagnosis Date   Gastrointestinal ulcer due to Helicobacter pylori  06/07/2002   Hyperlipidemia    Thyroid disease     Medications: Show/hide medication list[1]  Review of Systems  Last metabolic panel Lab Results  Component Value Date   GLUCOSE 123 (H) 06/01/2023   NA 141 06/01/2023   K 3.9 06/01/2023   CL 102 06/01/2023   CO2 24 06/01/2023   BUN 10 06/01/2023   CREATININE 0.82 06/01/2023   EGFR 71 06/01/2023   CALCIUM  10.0 06/01/2023   PROT 6.9 06/01/2023   ALBUMIN 4.3 06/01/2023   LABGLOB 2.6 06/01/2023   AGRATIO 2.1 09/20/2021   BILITOT 1.3 (H) 06/01/2023   ALKPHOS 83 06/01/2023   AST 19 06/01/2023   ALT 16 06/01/2023   ANIONGAP 11 08/16/2020   Last lipids Lab Results  Component Value Date   CHOL 265 (H) 06/01/2023   HDL 56 06/01/2023   LDLCALC 180 (H) 06/01/2023   TRIG 156 (H) 06/01/2023   CHOLHDL 4.7 (H) 06/01/2023   Last hemoglobin A1c Lab Results  Component Value Date   HGBA1C 6.1 (H) 06/01/2023   Last thyroid functions Lab Results  Component Value Date   TSH 1.580 06/01/2023   FREET4 0.93 06/01/2023        Objective    BP 138/72 (BP Location: Left Arm, Patient Position: Sitting, Cuff Size: Normal)   Pulse 91   Ht 5' 6 (1.676 m)   Wt 147 lb 1.6 oz (66.7 kg)   SpO2  100%   BMI 23.74 kg/m  BP Readings from Last 3 Encounters:  01/09/24 138/72  10/24/23 133/74  09/13/23 (!) 116/53   Wt Readings from Last 3 Encounters:  01/09/24 147 lb 1.6 oz (66.7 kg)  10/24/23 148 lb 9.6 oz (67.4 kg)  09/13/23 149 lb 8 oz (67.8 kg)        Physical Exam  Left knee: no erythema, no swelling, no tenderness to palpation of the medial left joint line, 5/5 strength    No results found for any visits on 01/09/24.  Assessment & Plan     Problem List Items Addressed This Visit     Essential (primary) hypertension   Forgetfulness   Hypercholesteremia   Hypothyroidism, postop   Osteopenia   Other Visit Diagnoses       Chronic pain of left knee    -  Primary     Acute pain of left knee       Relevant  Medications   diclofenac  Sodium (VOLTAREN  ARTHRITIS PAIN) 1 % GEL       Assessment and Plan Assessment & Plan Chronic left knee pain Well controlled. Previous x-ray was not completed due to symptom improvement. Pain likely due to inflammation or osteoarthritis. Voltaren  gel has been effective in managing symptoms. - Refilled Voltaren  gel with instructions to apply four times daily as needed.  Essential hypertension Chronic  Blood pressure is well controlled at 138/72 mmHg. Current medication regimen is effective. - Continue current antihypertensive medications: amlodipine  5mg  daily and olmesartan  20mg  daily   Postprocedural hypothyroidism Chronic  Managed with Synthroid . Current supply is sufficient until April. - Continue Synthroid  50 mcg daily. - Plan for labs at next visit to check thyroid function.  Hypercholesterolemia Managed with atorvastatin .  Chronic  - Continue atorvastatin  10 mg daily.   Forgetfullness  Patient noted to repeat questions during this encounter  Would recommend a MOCA assessment during follow up visit   No follow-ups on file.         Rockie Agent, MD  [facility] [phone] (phone) [phone] (fax)  [facility]     [1]  Outpatient Medications Prior to Visit  Medication Sig   amLODipine -olmesartan  (AZOR ) 5-20 MG tablet Take 1 tablet by mouth daily.   atorvastatin  (LIPITOR) 10 MG tablet TAKE 1 TABLET BY MOUTH EVERY DAY   azelastine  (ASTELIN ) 0.1 % nasal spray Place 1 spray into both nostrils 2 (two) times daily. Use in each nostril as directed   Multiple Vitamin (MULTI-VITAMINS) TABS Take 1 tablet by mouth daily.   SYNTHROID  50 MCG tablet TAKE 1 TABLET BY MOUTH DAILY BEFORE BREAKFAST   [DISCONTINUED] diclofenac  Sodium (VOLTAREN  ARTHRITIS PAIN) 1 % GEL Apply 4 g topically 4 (four) times daily.   No facility-administered medications prior to visit.

## 2024-01-09 NOTE — Progress Notes (Signed)
° °  Chief Complaint  Patient presents with   Tinea Pedis    She states that for 1 week she has had burning, redness, and swelling between her great toe and 2nd on her right foot. She is using OTC alcohol and foot spray. It looks moist and inflamed     HPI: 83 y.o. female presenting today as a new patient for evaluation of tenderness associated to the great toe pressing against the adjacent second digit.  Ongoing for about 1 month now.  She reports that she has never had this happen prior to this incident  Past Medical History:  Diagnosis Date   Gastrointestinal ulcer due to Helicobacter pylori 06/07/2002   Hyperlipidemia    Thyroid disease     Past Surgical History:  Procedure Laterality Date   CHOLECYSTECTOMY  2002   THYROIDECTOMY  1990    Allergies[1]   Physical Exam: General: The patient is alert and oriented x3 in no acute distress.  Dermatology: Skin is warm, dry and supple bilateral lower extremities.  Superficial focal breakdown of skin noted to the lateral aspect of the IPJ of the right great toe which appears to be pressing against the adjacent second digit  Vascular: Palpable pedal pulses bilaterally. Capillary refill within normal limits.  No appreciable edema.  No erythema.  Neurological: Grossly intact via light touch  Musculoskeletal Exam: No pedal deformities noted   Assessment/Plan of Care: 1.  Superficial breakdown of callus lateral aspect of the right great toe secondary to pressure from the adjacent second digit  -I do suspect that the patient began to experience symptoms based on change of shoe gear with the colder weather -Light debridement was performed today.  Simple Band-Aid was applied to the great toe -Silicone toe spacer dispensed.  Wear daily -Stressed the importance of wearing wide fitting shoes that do not irritate or constrict the toebox area -Return to clinic PRN       Thresa EMERSON Sar, DPM [facility]  Dr. Thresa EMERSON Sar,  DPM    [address] [place], [place] [address]                Office [phone]  Fax [phone]        [1] No Known Allergies

## 2024-02-16 ENCOUNTER — Ambulatory Visit: Admitting: Family Medicine

## 2024-02-21 ENCOUNTER — Encounter: Payer: Self-pay | Admitting: Family Medicine

## 2024-02-21 ENCOUNTER — Ambulatory Visit: Admitting: Family Medicine

## 2024-02-21 VITALS — BP 136/68 | HR 78 | Ht 66.0 in | Wt 149.6 lb

## 2024-02-21 DIAGNOSIS — I10 Essential (primary) hypertension: Secondary | ICD-10-CM

## 2024-02-21 DIAGNOSIS — G3184 Mild cognitive impairment, so stated: Secondary | ICD-10-CM | POA: Diagnosis not present

## 2024-02-21 DIAGNOSIS — E890 Postprocedural hypothyroidism: Secondary | ICD-10-CM

## 2024-02-21 DIAGNOSIS — E7800 Pure hypercholesterolemia, unspecified: Secondary | ICD-10-CM | POA: Diagnosis not present

## 2024-02-21 DIAGNOSIS — R413 Other amnesia: Secondary | ICD-10-CM | POA: Insufficient documentation

## 2024-02-21 DIAGNOSIS — M8589 Other specified disorders of bone density and structure, multiple sites: Secondary | ICD-10-CM

## 2024-02-21 DIAGNOSIS — K219 Gastro-esophageal reflux disease without esophagitis: Secondary | ICD-10-CM

## 2024-02-21 DIAGNOSIS — E559 Vitamin D deficiency, unspecified: Secondary | ICD-10-CM

## 2024-02-21 MED ORDER — AMLODIPINE-OLMESARTAN 5-20 MG PO TABS: 1.0000 | ORAL_TABLET | Freq: Every day | ORAL | 1 refills | Status: AC

## 2024-02-21 NOTE — Assessment & Plan Note (Signed)
 Chronic  Mild cognitive impairment with memory loss, not impacting daily life. No significant concerns from family. Discussed potential progression to Alzheimer's disease and the option of neurology referral. Medications for memory loss prevention may have more side effects than benefits. Emphasized maintaining cognitive activities and monitoring for changes. - Referred to neurology for further evaluation and potential Alzheimer's protein testing - Encouraged cognitive activities such as reading, Sudoku, and socializing - Scheduled follow-up in four months

## 2024-02-21 NOTE — Progress Notes (Signed)
 "  Established Patient Office Visit  Patient ID: Alison Blackwell, female    DOB: [birth year]  Age: 84 y.o. MRN: [id number] PCP: Sharma Coyer, MD  Chief Complaint  Patient presents with   Medical Management of Chronic Issues    Patient is present for f/u with PCP, patient is doing well today     Subjective:     HPI  Discussed the use of AI scribe software for clinical note transcription with the patient, who gave verbal consent to proceed.  History of Present Illness Alison Blackwell is an 84 year old female who presents for a follow-up visit for chronic hypertension.  Her blood pressure is currently 136/68 mmHg, and she continues to take amlodipine  5 mg and olmesartan  20 mg daily, which has been effective in managing her blood pressure.  She has a history of hypercholesterolemia and continues to take atorvastatin  10 mg daily.  She has hypothyroidism and is on chronic Synthroid  50 mcg daily.  There are concerns about her memory and cognition. During the last visit, she was noted to be forgetful and repeated questions. She was able to complete some tasks but had difficulty recalling a list of words and the current date. She writes down her appointments and bills to keep track of them. Her son and daughter assist her, and she does not drive independently. No significant impacts on her daily life have been noticed, and her family has not expressed concerns about her memory.  She completed up to the second year of college and engages in activities such as reading to keep her mind active.   Patient Active Problem List   Diagnosis Date Noted   Memory change 02/21/2024   Forgetfulness 01/09/2024   Osteopenia 11/24/2014   Essential (primary) hypertension 11/21/2014   Avitaminosis D 11/21/2014   Allergic rhinitis 12/12/2007   Acid reflux 05/17/2002   Hypercholesteremia 04/08/1998   Hypothyroidism, postop 08/15/1991      ROS    Objective:     BP 136/68 (BP  Location: Left Arm, Patient Position: Sitting, Cuff Size: Normal)   Pulse 78   Ht 5' 6 (1.676 m)   Wt 149 lb 9.6 oz (67.9 kg)   SpO2 100%   BMI 24.15 kg/m  BP Readings from Last 3 Encounters:  02/21/24 136/68  01/09/24 138/72  10/24/23 133/74   Wt Readings from Last 3 Encounters:  02/21/24 149 lb 9.6 oz (67.9 kg)  01/09/24 147 lb 1.6 oz (66.7 kg)  10/24/23 148 lb 9.6 oz (67.4 kg)      Physical Exam Vitals reviewed.  Constitutional:      General: She is not in acute distress.    Appearance: Normal appearance. She is not ill-appearing.  Cardiovascular:     Rate and Rhythm: Normal rate and regular rhythm.  Pulmonary:     Effort: Pulmonary effort is normal. No respiratory distress.     Breath sounds: No wheezing, rhonchi or rales.  Neurological:     General: No focal deficit present.     Mental Status: She is alert.     Comments: Oriented to self and location, unable to report date, year  Psychiatric:        Mood and Affect: Mood normal.        Behavior: Behavior normal.      No results found for any visits on 02/21/24.  Last CBC Lab Results  Component Value Date   WBC 6.0 09/20/2021   HGB 13.9 09/20/2021   HCT 40.9  09/20/2021   MCV 91 09/20/2021   MCH 31.0 09/20/2021   RDW 12.6 09/20/2021   PLT 210 09/20/2021   Last metabolic panel Lab Results  Component Value Date   GLUCOSE 123 (H) 06/01/2023   NA 141 06/01/2023   K 3.9 06/01/2023   CL 102 06/01/2023   CO2 24 06/01/2023   BUN 10 06/01/2023   CREATININE 0.82 06/01/2023   EGFR 71 06/01/2023   CALCIUM  10.0 06/01/2023   PROT 6.9 06/01/2023   ALBUMIN 4.3 06/01/2023   LABGLOB 2.6 06/01/2023   AGRATIO 2.1 09/20/2021   BILITOT 1.3 (H) 06/01/2023   ALKPHOS 83 06/01/2023   AST 19 06/01/2023   ALT 16 06/01/2023   ANIONGAP 11 08/16/2020   Last lipids Lab Results  Component Value Date   CHOL 265 (H) 06/01/2023   HDL 56 06/01/2023   LDLCALC 180 (H) 06/01/2023   TRIG 156 (H) 06/01/2023   CHOLHDL 4.7  (H) 06/01/2023   Last hemoglobin A1c Lab Results  Component Value Date   HGBA1C 6.1 (H) 06/01/2023   Last thyroid functions Lab Results  Component Value Date   TSH 1.580 06/01/2023   FREET4 0.93 06/01/2023   Last vitamin D  Lab Results  Component Value Date   VD25OH 58.8 06/01/2023   Last vitamin B12 and Folate No results found for: VITAMINB12, FOLATE    The ASCVD Risk score (Arnett DK, et al., 2019) failed to calculate for the following reasons:   The 2019 ASCVD risk score is only valid for ages 25 to 41   * - Cholesterol units were assumed  Outpatient Encounter Medications as of 02/21/2024  Medication Sig Note   amLODipine -olmesartan  (AZOR ) 5-20 MG tablet Take 1 tablet by mouth daily.    atorvastatin  (LIPITOR) 10 MG tablet TAKE 1 TABLET BY MOUTH EVERY DAY    azelastine  (ASTELIN ) 0.1 % nasal spray Place 1 spray into both nostrils 2 (two) times daily. Use in each nostril as directed    diclofenac  Sodium (VOLTAREN  ARTHRITIS PAIN) 1 % GEL Apply 4 g topically 4 (four) times daily.    Multiple Vitamin (MULTI-VITAMINS) TABS Take 1 tablet by mouth daily. 11/21/2014: Received from: [facility] Received Sig: Take by mouth.   SYNTHROID  50 MCG tablet TAKE 1 TABLET BY MOUTH DAILY BEFORE BREAKFAST    [DISCONTINUED] amLODipine -olmesartan  (AZOR ) 5-20 MG tablet Take 1 tablet by mouth daily.    No facility-administered encounter medications on file as of 02/21/2024.       Assessment & Plan:   Problem List Items Addressed This Visit     Acid reflux   Essential (primary) hypertension - Primary   Chronic  Blood pressure is well-controlled at 136/68 mmHg with current medication regimen. - Continue amlodipine  5 mg daily - Continue olmesartan  20 mg daily - Refilled amlodipine  and olmesartan  prescriptions - Ordered CMP to monitor kidney function and electrolytes      Relevant Medications   amLODipine -olmesartan  (AZOR ) 5-20 MG tablet   Other Relevant Orders    Comprehensive metabolic panel with GFR   Forgetfulness   Chronic  Mild cognitive impairment with memory loss, not impacting daily life. No significant concerns from family. Discussed potential progression to Alzheimer's disease and the option of neurology referral. Medications for memory loss prevention may have more side effects than benefits. Emphasized maintaining cognitive activities and monitoring for changes. - Referred to neurology for further evaluation and potential Alzheimer's protein testing - Encouraged cognitive activities such as reading, Sudoku, and socializing - Scheduled follow-up  in four months      Hypercholesteremia   Chronic  Continued management with atorvastatin . - Continue atorvastatin  10 mg daily - Ordered lipid panel to assess cholesterol levels      Relevant Medications   amLODipine -olmesartan  (AZOR ) 5-20 MG tablet   Other Relevant Orders   Comprehensive metabolic panel with GFR   Lipid Panel With LDL/HDL Ratio   Hypothyroidism, postop   Chronic management with Synthroid . - Continue Synthroid  50 mcg daily - Ordered thyroid function tests to monitor thyroid levels      Relevant Orders   TSH + free T4   Memory change   Chronic  Reports no decreased quality of life however given MOCA score, I recommended neurology evaluation  Neuro referral submitted        Assessment and Plan Assessment & Plan     Return in about 4 months (around 06/20/2024) for Chronic F/U.    Rockie Agent, MD [facility]  "

## 2024-02-21 NOTE — Assessment & Plan Note (Signed)
 Chronic  Blood pressure is well-controlled at 136/68 mmHg with current medication regimen. - Continue amlodipine  5 mg daily - Continue olmesartan  20 mg daily - Refilled amlodipine  and olmesartan  prescriptions - Ordered CMP to monitor kidney function and electrolytes

## 2024-02-21 NOTE — Assessment & Plan Note (Signed)
 Chronic  Continued management with atorvastatin . - Continue atorvastatin  10 mg daily - Ordered lipid panel to assess cholesterol levels

## 2024-02-21 NOTE — Assessment & Plan Note (Signed)
 Chronic management with Synthroid . - Continue Synthroid  50 mcg daily - Ordered thyroid function tests to monitor thyroid levels

## 2024-02-21 NOTE — Assessment & Plan Note (Signed)
 Chronic  Reports no decreased quality of life however given MOCA score, I recommended neurology evaluation  Neuro referral submitted

## 2024-02-21 NOTE — Patient Instructions (Signed)
" ° °  ° ° ° °  Contains text generated by [employer].    To keep you healthy, please keep in mind the following health maintenance items that you are due for:   Health Maintenance Due  Topic Date Due   COVID-19 Vaccine (7 - 2025-26 season) 09/25/2023     Best Wishes,   Dr. Lang  "

## 2024-02-22 LAB — COMPREHENSIVE METABOLIC PANEL WITH GFR
ALT: 12 [IU]/L (ref 0–32)
AST: 16 [IU]/L (ref 0–40)
Albumin: 4.5 g/dL (ref 3.7–4.7)
Alkaline Phosphatase: 101 [IU]/L (ref 48–129)
BUN/Creatinine Ratio: 10 — ABNORMAL LOW (ref 12–28)
BUN: 9 mg/dL (ref 8–27)
Bilirubin Total: 1.8 mg/dL — ABNORMAL HIGH (ref 0.0–1.2)
CO2: 24 mmol/L (ref 20–29)
Calcium: 10.3 mg/dL (ref 8.7–10.3)
Chloride: 103 mmol/L (ref 96–106)
Creatinine, Ser: 0.93 mg/dL (ref 0.57–1.00)
Globulin, Total: 2.4 g/dL (ref 1.5–4.5)
Glucose: 118 mg/dL — ABNORMAL HIGH (ref 70–99)
Potassium: 4.6 mmol/L (ref 3.5–5.2)
Sodium: 140 mmol/L (ref 134–144)
Total Protein: 6.9 g/dL (ref 6.0–8.5)
eGFR: 61 mL/min/{1.73_m2}

## 2024-02-22 LAB — LIPID PANEL WITH LDL/HDL RATIO
Cholesterol, Total: 171 mg/dL (ref 100–199)
HDL: 61 mg/dL
LDL Chol Calc (NIH): 89 mg/dL (ref 0–99)
LDL/HDL Ratio: 1.5 ratio (ref 0.0–3.2)
Triglycerides: 116 mg/dL (ref 0–149)
VLDL Cholesterol Cal: 21 mg/dL (ref 5–40)

## 2024-02-22 LAB — TSH+FREE T4
Free T4: 1.36 ng/dL (ref 0.82–1.77)
TSH: 0.374 u[IU]/mL — ABNORMAL LOW (ref 0.450–4.500)

## 2024-02-28 ENCOUNTER — Ambulatory Visit: Payer: Self-pay | Admitting: Family Medicine

## 2024-05-01 ENCOUNTER — Ambulatory Visit

## 2024-06-24 ENCOUNTER — Ambulatory Visit: Admitting: Family Medicine
